# Patient Record
Sex: Female | Born: 1960
Health system: Southern US, Community
[De-identification: ages and names within clinical notes are randomized; demographics above are authoritative.]

## PROBLEM LIST (undated history)

## (undated) DIAGNOSIS — Z86010 Personal history of colonic polyps: Secondary | ICD-10-CM

## (undated) DIAGNOSIS — J309 Allergic rhinitis, unspecified: Secondary | ICD-10-CM

## (undated) DIAGNOSIS — F329 Major depressive disorder, single episode, unspecified: Secondary | ICD-10-CM

## (undated) DIAGNOSIS — E041 Nontoxic single thyroid nodule: Secondary | ICD-10-CM

## (undated) DIAGNOSIS — I8390 Asymptomatic varicose veins of unspecified lower extremity: Secondary | ICD-10-CM

## (undated) DIAGNOSIS — C439 Malignant melanoma of skin, unspecified: Secondary | ICD-10-CM

## (undated) DIAGNOSIS — M19049 Primary osteoarthritis, unspecified hand: Secondary | ICD-10-CM

## (undated) DIAGNOSIS — Z860101 Personal history of adenomatous and serrated colon polyps: Secondary | ICD-10-CM

## (undated) DIAGNOSIS — T7840XA Allergy, unspecified, initial encounter: Secondary | ICD-10-CM

## (undated) DIAGNOSIS — Z8659 Personal history of other mental and behavioral disorders: Secondary | ICD-10-CM

## (undated) HISTORY — DX: Personal history of colonic polyps: Z86.010

## (undated) HISTORY — DX: Asymptomatic varicose veins of unspecified lower extremity: I83.90

## (undated) HISTORY — PX: COLONOSCOPY: SHX174

## (undated) HISTORY — PX: POLYPECTOMY: SHX149

## (undated) HISTORY — DX: Major depressive disorder, single episode, unspecified: F32.9

## (undated) HISTORY — DX: Personal history of other mental and behavioral disorders: Z86.59

## (undated) HISTORY — DX: Nontoxic single thyroid nodule: E04.1

## (undated) HISTORY — PX: COLONOSCOPY W/ POLYPECTOMY: SHX1380

## (undated) HISTORY — PX: ANAL FISSURE REPAIR: SHX2312

## (undated) HISTORY — DX: Primary osteoarthritis, unspecified hand: M19.049

## (undated) HISTORY — DX: Malignant melanoma of skin, unspecified: C43.9

## (undated) HISTORY — DX: Allergic rhinitis, unspecified: J30.9

## (undated) HISTORY — DX: Personal history of adenomatous and serrated colon polyps: Z86.0101

---
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
## 1990-07-10 HISTORY — PX: KNEE ARTHROSCOPY: SUR90

## 1998-07-10 HISTORY — PX: OTHER SURGICAL HISTORY: SHX169

## 1998-12-09 DIAGNOSIS — E041 Nontoxic single thyroid nodule: Secondary | ICD-10-CM

## 1998-12-09 HISTORY — DX: Nontoxic single thyroid nodule: E04.1

## 1998-12-15 ENCOUNTER — Ambulatory Visit (HOSPITAL_COMMUNITY): Admission: RE | Admit: 1998-12-15 | Discharge: 1998-12-15 | Payer: Self-pay | Admitting: Family Medicine

## 1998-12-15 ENCOUNTER — Encounter: Payer: Self-pay | Admitting: Family Medicine

## 1998-12-23 ENCOUNTER — Encounter: Payer: Self-pay | Admitting: Family Medicine

## 1998-12-23 ENCOUNTER — Ambulatory Visit (HOSPITAL_COMMUNITY): Admission: RE | Admit: 1998-12-23 | Discharge: 1998-12-23 | Payer: Self-pay | Admitting: Family Medicine

## 1999-02-18 ENCOUNTER — Ambulatory Visit (HOSPITAL_COMMUNITY): Admission: RE | Admit: 1999-02-18 | Discharge: 1999-02-18 | Payer: Self-pay | Admitting: *Deleted

## 1999-03-10 ENCOUNTER — Ambulatory Visit (HOSPITAL_COMMUNITY): Admission: RE | Admit: 1999-03-10 | Discharge: 1999-03-10 | Payer: Self-pay | Admitting: Obstetrics and Gynecology

## 1999-03-10 ENCOUNTER — Encounter: Payer: Self-pay | Admitting: Obstetrics and Gynecology

## 2000-12-12 ENCOUNTER — Other Ambulatory Visit: Admission: RE | Admit: 2000-12-12 | Discharge: 2000-12-12 | Payer: Self-pay | Admitting: Obstetrics and Gynecology

## 2000-12-14 ENCOUNTER — Encounter (INDEPENDENT_AMBULATORY_CARE_PROVIDER_SITE_OTHER): Payer: Self-pay | Admitting: Specialist

## 2000-12-14 ENCOUNTER — Ambulatory Visit (HOSPITAL_COMMUNITY): Admission: RE | Admit: 2000-12-14 | Discharge: 2000-12-14 | Payer: Self-pay | Admitting: Obstetrics and Gynecology

## 2001-12-23 ENCOUNTER — Inpatient Hospital Stay (HOSPITAL_COMMUNITY): Admission: AD | Admit: 2001-12-23 | Discharge: 2001-12-27 | Payer: Self-pay | Admitting: Obstetrics and Gynecology

## 2002-01-20 ENCOUNTER — Other Ambulatory Visit: Admission: RE | Admit: 2002-01-20 | Discharge: 2002-01-20 | Payer: Self-pay | Admitting: Obstetrics and Gynecology

## 2003-01-27 ENCOUNTER — Other Ambulatory Visit: Admission: RE | Admit: 2003-01-27 | Discharge: 2003-01-27 | Payer: Self-pay | Admitting: Obstetrics and Gynecology

## 2003-08-18 ENCOUNTER — Ambulatory Visit (HOSPITAL_COMMUNITY): Admission: RE | Admit: 2003-08-18 | Discharge: 2003-08-18 | Payer: Self-pay | Admitting: Obstetrics and Gynecology

## 2004-09-09 ENCOUNTER — Ambulatory Visit (HOSPITAL_COMMUNITY): Admission: RE | Admit: 2004-09-09 | Discharge: 2004-09-09 | Payer: Self-pay | Admitting: Obstetrics and Gynecology

## 2005-09-20 ENCOUNTER — Ambulatory Visit (HOSPITAL_COMMUNITY): Admission: RE | Admit: 2005-09-20 | Discharge: 2005-09-20 | Payer: Self-pay | Admitting: Obstetrics and Gynecology

## 2006-08-10 DIAGNOSIS — C439 Malignant melanoma of skin, unspecified: Secondary | ICD-10-CM

## 2006-08-10 HISTORY — DX: Malignant melanoma of skin, unspecified: C43.9

## 2006-09-27 ENCOUNTER — Ambulatory Visit (HOSPITAL_COMMUNITY): Admission: RE | Admit: 2006-09-27 | Discharge: 2006-09-27 | Payer: Self-pay | Admitting: Obstetrics and Gynecology

## 2007-10-02 ENCOUNTER — Ambulatory Visit (HOSPITAL_COMMUNITY): Admission: RE | Admit: 2007-10-02 | Discharge: 2007-10-02 | Payer: Self-pay | Admitting: Obstetrics and Gynecology

## 2008-11-11 ENCOUNTER — Ambulatory Visit (HOSPITAL_COMMUNITY): Admission: RE | Admit: 2008-11-11 | Discharge: 2008-11-11 | Payer: Self-pay | Admitting: Obstetrics and Gynecology

## 2009-11-17 ENCOUNTER — Ambulatory Visit (HOSPITAL_COMMUNITY): Admission: RE | Admit: 2009-11-17 | Discharge: 2009-11-17 | Payer: Self-pay | Admitting: Obstetrics and Gynecology

## 2010-11-25 NOTE — Discharge Summary (Signed)
   NAME:  Tracy Moore, Tracy Moore                       ACCOUNT NO.:  [id number]   MEDICAL RECORD NO.:  [id number]                   PATIENT TYPE:  NP   LOCATION:  9148                                 FACILITY:  [facility]   PHYSICIAN:  Lenoard Aden, M.D.             DATE OF BIRTH:  March 13, 1961   DATE OF ADMISSION:  12/23/2001  DATE OF DISCHARGE:  12/27/2001                                 DISCHARGE SUMMARY   BRIEF HISTORY:  The patient underwent an uncomplicated repeat low-segment  transverse cesarean section on January 22, 2002.   HOSPITAL COURSE:  Postoperative course to include afebrile on postoperative  days #1, 2, 3, and 4. Postpartum hemoglobin 10.4, white blood cell count of  9.4. Tolerated a regular diet while on postoperative day #1.   DISPOSITION:  Discharged to home on day #4 given Tylox #30 for pain and  ibuprofen, prenatal vitamins. Discharge teaching had been done.   FOLLOW UP:  Followup in the office in 4-6 weeks.                                               Lenoard Aden, M.D.    RJT/MEDQ  D:  01/31/2002  T:  02/09/2002  Job:  [id number]

## 2010-11-25 NOTE — Op Note (Signed)
[facility]  Patient:    Tracy Moore, Tracy Moore                    MRN: [id number] Proc. Date: 12/14/00 Adm. Date:  [id number] Attending:  Morene Antu                           Operative Report  PREOPERATIVE DIAGNOSIS:       Missed abortion.  POSTOPERATIVE DIAGNOSIS:      Missed abortion.  PROCEDURE:                    Dilatation and evacuation.  SURGEON:                      Sherry A. Rosalio Macadamia, M.D.  ANESTHESIA:                   MAC.  ESTIMATED BLOOD LOSS:         10 cc.  INDICATIONS:                  This is a 50 year old, G2, P1-0-0-1 woman who was seen for routine prenatal exam. Doppler could not be obtained for fetal heart tones. The patient was 9 to 10 weeks by dates. Ultrasound was performed showing fetal demise approximately 9.1 week size crown-rump length with no fetal heart tones present. Because of this, the patient is brought to the operating room for D&E.  FINDINGS:                     A 9 to 10-week size anteflexed uterus, no adnexal mass.  DESCRIPTION OF PROCEDURE:     The patient was brought into the operating room and given adequate IV sedation. She was placed in a dorsal lithotomy position. Perineum was washed with Betadine. Pelvic examination was performed. Bladder was in-and-out catheterized. The patient was draped in a sterile fashion after surgeons gloves were changed. Speculum was placed within the vagina. The vagina was washed with Betadine. Paracervical block was performed with 1% Nesacaine. Anterior lip of the cervix of the cervix was grasped with a single-tooth tenaculum. The cervix was dilated with the [facility] dilators to a #29. A #9 mm curved curet was introduced into the endometrial cavity. Suction was applied. Placental tissue was removed with polyp forceps and sent for genetic testing. Further suction was continued until no further tissue was present. Very brief sharp curettage was performed with no  tissue present. Suction was repeated with adequate hemostasis and no tissue present. All instruments were removed from the vagina. The patient was taken out of the dorsal lithotomy position. She was awakened. She was moved from the operating table to a stretcher in stable condition. Complications were none. Estimated blood loss was 10 cc. DD:  12/14/00 TD:  12/15/00 Job: [id number]

## 2010-11-25 NOTE — Op Note (Signed)
[facility]  Patient:    Tracy Moore, Tracy Moore Visit Number: [id number] MRN: [id number]          Service Type: OBS Location: [facility] Attending Physician:  Lenoard Aden Dictated by:   Lenoard Aden, M.D. Proc. Date: 12/23/01   CC:         [facility]   Operative Report  PREOPERATIVE DIAGNOSIS:       A 39-week intrauterine pregnancy, two previous cesarean sections with repeat.  POSTOPERATIVE DIAGNOSIS:      A 39-week intrauterine pregnancy, two previous cesarean sections with repeat.  OPERATION:                    Repeat low transverse cesarean section.  SURGEON:                      Lenoard Aden, M.D.  ANESTHESIA:                   Spinal by Gretta Cool., M.D.  ESTIMATED BLOOD LOSS:         1000 cc.  COMPLICATIONS:                None.  DRAINS:                       Foley.  DISPOSITION:                  The patient in recovery in good condition.  FINDINGS:                     Full term living female, occiput anterior position. Placenta, anterior, removed manually intact. Normal tubes, normal ovaries.  BRIEF OPERATIVE NOTE:         After being apprised of the risks of anesthesia, infection, bleeding, intraabdominal organs need for repair, the patient went to the operating room where she was administered a spinal anesthetic without complications and prepped and draped in the usual sterile fashion. A Foley catheter was placed. After achieving adequate anesthesia, dilute Marcaine solution was placed in the area of the old Pfannenstiel skin incision. An incision was made with a scalpel, carried down to the fascia, which was nicked in the midline and open transversely using Mayo scissors. The rectus muscles dissected sharply in the midline. The peritoneum entered sharply and  a bladder blade placed. Visceral peritoneum scored in a smile-like fashion, the uterus scored in a smile-like fashion and was cleared of fluid.  Atraumatic delivery of a full-term living female, handed to the pediatricians in attendance. Apgars 8 and 9. The placenta delivered manually intact, three-vessel cord noted. Uterus was exteriorized. Normal tubes and ovaries noted. The uterus curetted using a dry lap pack and closed using 0 Monocryl in continuous running fashion. A second imbricating layer placed. Normal tubes and ovaries reinspected.  Bladder flap reinspected. Additional suture placed at the left lateral margin of the incision for hemostasis which is achieved without difficulty with one interrupted suture. After irrigation was accomplished, the fascia closed using a 0 Vicryl in continuous running fashion. The skin closed using staples. The patient tolerated the procedure well and was transferred to recovery in good condition. Dictated by:   Lenoard Aden, M.D. Attending Physician:  Lenoard Aden DD:  12/23/01 TD:  12/25/01 Job: [id number]

## 2010-11-25 NOTE — H&P (Signed)
[facility]  Patient:    Tracy Moore, Tracy Moore Visit Number: [id number] MRN: [id number]          Service Type: OBS Location: [facility] Attending Physician:  Lenoard Aden Dictated by:   Lenoard Aden, M.D. Adm. Date:  12/23/01                           History and Physical  CHIEF COMPLAINT:              Elective repeat cesarean section.  HISTORY OF PRESENT ILLNESS:   The patient is a 50 year old white female, G3, P1, EDD of December 30, 2001, at 39 weeks, who presents for elective repeat C-section.  ALLERGIES:                    No known drug allergies.  MEDICATIONS;                  Prenatal vitamins.  PAST OBSTETRIC HISTORY:       1. History of primary C-section in 1997, 10                                  pound female.                               2. Spontaneous loss and fix of tube.  FAMILY HISTORY:               Hypertension, diabetes.  Otherwise noncontributory.  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  REVIEW OF SYSTEMS:            Otherwise negative.  PRENATAL LABORATORY DATA:     Blood type 0 negative, Rh antibody negative, Rubella immune, hepatitis B surface antigen negative, HIV nonreactive.  GC and Chlamydia negative.  PHYSICAL EXAMINATION:  GENERAL:                      Well-developed, well-nourished white female in no apparent distress.  HEENT:                        Normal.  LUNGS:                        Clear.  HEART:                        Regular rate and rhythm.  ABDOMEN:                      Soft, gravid, nontender.  Estimated fetal weight of 8,5 pounds.  PELVIC:                       Cervix 2 cm, thick, vertex, and out of the pelvis.  EXTREMITIES:                  No cords.  NEUROLOGIC:                   Nonfocal.  IMPRESSION:                   A 39 week intrauterine pregnancy  for repeat cesarean section.  PLAN:                         Proceed with elective  repeat cesarean section. Risks of anesthesia, infection, bleeding, injury to abdominal organs with need to repair are discussed.  The patient acknowledges and desires to proceed. Dictated by:   Lenoard Aden, M.D. Attending Physician:  Lenoard Aden DD:  12/23/01 TD:  12/23/01 Job: [id number]

## 2010-12-01 ENCOUNTER — Other Ambulatory Visit (HOSPITAL_COMMUNITY): Payer: Self-pay | Admitting: Obstetrics and Gynecology

## 2010-12-01 DIAGNOSIS — Z1231 Encounter for screening mammogram for malignant neoplasm of breast: Secondary | ICD-10-CM

## 2010-12-19 ENCOUNTER — Ambulatory Visit (HOSPITAL_COMMUNITY)
Admission: RE | Admit: 2010-12-19 | Discharge: 2010-12-19 | Disposition: A | Discharge status: Home / Self Care | Payer: BC Managed Care – PPO | Source: Ambulatory Visit | Attending: Obstetrics and Gynecology | Admitting: Obstetrics and Gynecology

## 2010-12-19 DIAGNOSIS — Z1231 Encounter for screening mammogram for malignant neoplasm of breast: Secondary | ICD-10-CM | POA: Insufficient documentation

## 2011-12-08 ENCOUNTER — Other Ambulatory Visit (HOSPITAL_COMMUNITY): Payer: Self-pay | Admitting: Obstetrics and Gynecology

## 2011-12-08 DIAGNOSIS — Z1231 Encounter for screening mammogram for malignant neoplasm of breast: Secondary | ICD-10-CM

## 2012-01-10 ENCOUNTER — Ambulatory Visit (HOSPITAL_COMMUNITY): Payer: BC Managed Care – PPO

## 2012-01-10 ENCOUNTER — Ambulatory Visit (HOSPITAL_COMMUNITY)
Admission: RE | Admit: 2012-01-10 | Discharge: 2012-01-10 | Disposition: A | Discharge status: Home / Self Care | Payer: PRIVATE HEALTH INSURANCE | Source: Ambulatory Visit | Attending: Obstetrics and Gynecology | Admitting: Obstetrics and Gynecology

## 2012-01-10 DIAGNOSIS — Z1231 Encounter for screening mammogram for malignant neoplasm of breast: Secondary | ICD-10-CM | POA: Insufficient documentation

## 2013-01-28 ENCOUNTER — Other Ambulatory Visit (HOSPITAL_COMMUNITY): Payer: Self-pay | Admitting: Obstetrics and Gynecology

## 2013-01-28 DIAGNOSIS — Z1231 Encounter for screening mammogram for malignant neoplasm of breast: Secondary | ICD-10-CM

## 2013-02-12 ENCOUNTER — Ambulatory Visit (HOSPITAL_COMMUNITY)
Admission: RE | Admit: 2013-02-12 | Discharge: 2013-02-12 | Disposition: A | Discharge status: Home / Self Care | Payer: PRIVATE HEALTH INSURANCE | Source: Ambulatory Visit | Attending: Obstetrics and Gynecology | Admitting: Obstetrics and Gynecology

## 2013-02-12 DIAGNOSIS — Z1231 Encounter for screening mammogram for malignant neoplasm of breast: Secondary | ICD-10-CM | POA: Insufficient documentation

## 2013-08-27 ENCOUNTER — Ambulatory Visit: Payer: PRIVATE HEALTH INSURANCE | Admitting: Family Medicine

## 2013-09-02 ENCOUNTER — Ambulatory Visit: Payer: PRIVATE HEALTH INSURANCE | Admitting: Family Medicine

## 2013-09-08 ENCOUNTER — Encounter: Payer: Self-pay | Admitting: Family Medicine

## 2013-09-08 ENCOUNTER — Ambulatory Visit (INDEPENDENT_AMBULATORY_CARE_PROVIDER_SITE_OTHER): Payer: PRIVATE HEALTH INSURANCE | Admitting: Family Medicine

## 2013-09-08 VITALS — BP 103/70 | HR 62 | Temp 98.2°F | Resp 16 | Ht 66.5 in | Wt 164.0 lb

## 2013-09-08 DIAGNOSIS — Z7689 Persons encountering health services in other specified circumstances: Secondary | ICD-10-CM

## 2013-09-08 DIAGNOSIS — E041 Nontoxic single thyroid nodule: Secondary | ICD-10-CM | POA: Diagnosis not present

## 2013-09-08 DIAGNOSIS — Z7189 Other specified counseling: Secondary | ICD-10-CM

## 2013-09-08 NOTE — Progress Notes (Signed)
Office Note 09/08/2013  CC:  Chief Complaint  Patient presents with  . Establish Care    HPI:  Tracy Moore is a 54 y.o. White female who is here to establish care. Patient's most recent primary MD: Dr. Harriet Butte at [facility] due to insurance. Old records were not reviewed prior to or during today's visit.  No acute complaints to discuss today.  Past Medical History  Diagnosis Date  . Melanoma 2012    Righ thigh (Dr. Nevada Crane)  . Depression   . Thyroid nodule     Dr. Dwyane Dee following q1-2 yrs.  Pt has been euthyroid (was on synthroid at one point in time but this was d/c'd per pt)  . Hay fever     spring/fall    Past Surgical History  Procedure Laterality Date  . Cesarean section  1997 and 2003  . Knee arthroscopy Left 1992    Family History  Problem Relation Age of Onset  . Arthritis Mother   . Hypertension Father   . Arthritis Father   . Heart disease Father   . Autism Son   . Dementia Maternal Grandmother   . Cancer Maternal Grandfather     prostate  . Cancer Paternal Grandfather     colon cancer    History   Social History  . Marital Status: Married    Spouse Name: N/A    Number of Children: N/A  . Years of Education: N/A   Occupational History  . Not on file.   Social History Main Topics  . Smoking status: Never Smoker   . Smokeless tobacco: Never Used  . Alcohol Use: Yes     Comment: socially  . Drug Use: No  . Sexual Activity: Not on file   Other Topics Concern  . Not on file   Social History Narrative   Married, daughter 65 y/o , son 22 y/o (has autism).   Lives in [place].   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  No drugs.    Outpatient Encounter Prescriptions as of 09/08/2013  Medication Sig  . Docusate Sodium 100 MG/5ML ENEM Take 100 mg by mouth as needed.  Marland Kitchen levonorgestrel (MIRENA) 20 MCG/24HR IUD 1 each by Intrauterine route once.    No Known Allergies  ROS Review of Systems   Constitutional: Negative for fever and fatigue.  HENT: Negative for congestion and sore throat.   Eyes: Negative for visual disturbance.  Respiratory: Negative for cough.   Cardiovascular: Negative for chest pain.  Gastrointestinal: Negative for nausea and abdominal pain.  Genitourinary: Negative for dysuria.  Musculoskeletal: Positive for neck pain (seeing a chiropracter recently). Negative for back pain and joint swelling.  Skin: Negative for rash.  Neurological: Negative for weakness and headaches.  Hematological: Negative for adenopathy.    PE; Blood pressure 103/70, pulse 62, temperature 98.2 F (36.8 C), temperature source Temporal, resp. rate 16, height 5' 6.5" (1.689 m), weight 164 lb (74.39 kg), SpO2 98.00%. Gen: Alert, well appearing.  Patient is oriented to person, place, time, and situation. ZDG:LOVF: no injection, icteris, swelling, or exudate.  EOMI, PERRLA. Mouth: lips without lesion/swelling.  Oral mucosa pink and moist. Oropharynx without erythema, exudate, or swelling.  CV: RRR, no m/r/g.   LUNGS: CTA bilat, nonlabored resps, good aeration in all lung fields. EXT: no clubbing, cyanosis, or edema.   Pertinent labs:  None today  ASSESSMENT AND PLAN:   Thyroid nodule Per pt has been determined to  be benign and stable on f/u with Dr. Dwyane Dee. Obtain records. Has been euthyroid.  Encounter to establish care with new doctor Discussed med,surg,social, famx hx. Plan for CPE in the next few months, refer for colon cancer screening at that time.   An After Visit Summary was printed and given to the patient.  Return for schedule CPE (fasting) in late spring/early summer.

## 2013-09-08 NOTE — Assessment & Plan Note (Signed)
Discussed med,surg,social, famx hx. Plan for CPE in the next few months, refer for colon cancer screening at that time.

## 2013-09-08 NOTE — Assessment & Plan Note (Signed)
Per pt has been determined to be benign and stable on f/u with Dr. Dwyane Dee. Obtain records. Has been euthyroid.

## 2013-09-08 NOTE — Progress Notes (Signed)
Pre visit review using our clinic review tool, if applicable. No additional management support is needed unless otherwise documented below in the visit note. 

## 2013-09-29 ENCOUNTER — Encounter: Payer: Self-pay | Admitting: Family Medicine

## 2013-10-06 ENCOUNTER — Ambulatory Visit (INDEPENDENT_AMBULATORY_CARE_PROVIDER_SITE_OTHER): Payer: PRIVATE HEALTH INSURANCE | Admitting: Endocrinology

## 2013-10-06 ENCOUNTER — Encounter: Payer: Self-pay | Admitting: Endocrinology

## 2013-10-06 VITALS — BP 110/74 | HR 73 | Temp 97.8°F | Resp 16 | Ht 66.5 in | Wt 164.0 lb

## 2013-10-06 DIAGNOSIS — E041 Nontoxic single thyroid nodule: Secondary | ICD-10-CM

## 2013-10-06 DIAGNOSIS — R635 Abnormal weight gain: Secondary | ICD-10-CM

## 2013-10-06 LAB — T4, FREE: Free T4: 0.77 ng/dL (ref 0.60–1.60)

## 2013-10-06 LAB — TSH: TSH: 0.76 u[IU]/mL (ref 0.35–5.50)

## 2013-10-06 NOTE — Progress Notes (Signed)
Patient ID: Tracy Moore, female   DOB: 1961-01-25, 53 y.o.   MRN: [id number]    Reason for Appointment: Followup of thyroid nodule     History of Present Illness:   The patient's thyroid nodule was first discovered in the year 2000 incidentally by her gynecologist Initially the nodule was 14 mm in size. Biopsy was done soon after diagnosis and showed mostly colloid with a few benign follicular cells Clinically the nodule is about 2 cm in 2003 and has been about the same size since then. She was also tried on thyroid suppression for sometime but this caused relatively low TSH Also previously had negative thyroid antibodies  She has had no difficulty with swallowing  Does not feel like she has any choking sensation in her neck or pressure in any position or when lying down.  No results found for this basename: TSH   No results found for any previous visit.     Medication List       This list is accurate as of: 10/06/13  9:17 AM.  Always use your most recent med list.               Docusate Sodium 100 MG/5ML Enem  Take 100 mg by mouth as needed.     levonorgestrel 20 MCG/24HR IUD  Commonly known as:  MIRENA  1 each by Intrauterine route once.     Resveratrol 250 MG Caps  Take by mouth.     St Johns Wort 1000 MG Caps  Take by mouth.        Allergies: No Known Allergies  Past Medical History  Diagnosis Date  . Melanoma 08/2006    Righ thigh (Dr. Nevada Crane)  . Depression   . Thyroid nodule 12/1998    Bx benign.Dr. Dwyane Dee following q1-2 yrs.  Pt has been euthyroid (was on synthroid at one point in time but this was d/c'd per pt)  . Hay fever     spring/fall    Past Surgical History  Procedure Laterality Date  . Cesarean section  1997 and 2003  . Knee arthroscopy Left 1992  . Thyroid nodule biopsy  2000    Benign    Family History  Problem Relation Age of Onset  . Arthritis Mother   . Hypertension Father   . Arthritis Father   . Heart disease Father   .  Autism Son   . Dementia Maternal Grandmother   . Cancer Maternal Grandfather     prostate  . Cancer Paternal Grandfather     colon cancer    Social History:  reports that she has never smoked. She has never used smokeless tobacco. She reports that she drinks alcohol. She reports that she does not use illicit drugs.   Review of Systems:  She is concerned about her tendency to weight gain. She has gained about 12 pounds Since 2009 She tries to exercise regularly with walking 2-6 miles per week Did not complain of any unusual fatigue. Is not interested in a weight loss medication  There is no history of high blood pressure.             No  history of Diabetes.      She is on [employer] for  "mood stabilization"   Not clear if she is in menopause since she is has an IUD with progesterone. Has seen gynecologist recently          Examination:   BP 110/74  Pulse  73  Temp(Src) 97.8 F (36.6 C)  Resp 16  Ht 5' 6.5" (1.689 m)  Wt 164 lb (74.39 kg)  BMI 26.08 kg/m2  SpO2 96%   General Appearance:  well-looking, no puffiness of the face or eyes         Eyes:  normal externally        Neck: The thyroid shows a left-sided 2 cm firm nodule, smooth. Right side is not palpable. No lymphadenopathy in the neck REFLEXES: at biceps are normal.  Skin: No dryness, no swelling of hands        Assessment/Plan:  Long-standing solitary left-sided nodule, appears unchanged in size since 2009 This had been benign on biopsy in the year 2000   Reassured her that the thyroid nodule is stable and does not need any further evaluation Will follow her every 2 years  Discussed general principles of weight management with diet and exercise Will check thyroid functions also today   [facility] 10/06/2013

## 2013-10-07 ENCOUNTER — Encounter: Payer: Self-pay | Admitting: Family Medicine

## 2013-10-07 NOTE — Progress Notes (Signed)
Quick Note:  Please let patient know that the lab result is normal and no further action needed ______ 

## 2014-02-25 ENCOUNTER — Other Ambulatory Visit (HOSPITAL_COMMUNITY): Payer: Self-pay | Admitting: Obstetrics and Gynecology

## 2014-02-25 DIAGNOSIS — Z1231 Encounter for screening mammogram for malignant neoplasm of breast: Secondary | ICD-10-CM

## 2014-03-04 ENCOUNTER — Ambulatory Visit (INDEPENDENT_AMBULATORY_CARE_PROVIDER_SITE_OTHER): Payer: PRIVATE HEALTH INSURANCE | Admitting: Family Medicine

## 2014-03-04 ENCOUNTER — Encounter: Payer: Self-pay | Admitting: Family Medicine

## 2014-03-04 VITALS — BP 117/77 | HR 65 | Temp 98.0°F | Resp 16 | Ht 66.5 in | Wt 165.0 lb

## 2014-03-04 DIAGNOSIS — B80 Enterobiasis: Secondary | ICD-10-CM | POA: Insufficient documentation

## 2014-03-04 DIAGNOSIS — Z1211 Encounter for screening for malignant neoplasm of colon: Secondary | ICD-10-CM

## 2014-03-04 DIAGNOSIS — Z0000 Encounter for general adult medical examination without abnormal findings: Secondary | ICD-10-CM

## 2014-03-04 LAB — COMPREHENSIVE METABOLIC PANEL
ALBUMIN: 3.9 g/dL (ref 3.5–5.2)
ALK PHOS: 66 U/L (ref 39–117)
ALT: 28 U/L (ref 0–35)
AST: 21 U/L (ref 0–37)
BILIRUBIN TOTAL: 0.7 mg/dL (ref 0.2–1.2)
BUN: 13 mg/dL (ref 6–23)
CALCIUM: 9 mg/dL (ref 8.4–10.5)
CO2: 28 mEq/L (ref 19–32)
CREATININE: 0.8 mg/dL (ref 0.4–1.2)
Chloride: 104 mEq/L (ref 96–112)
GFR: 85.12 mL/min (ref >60.00)
Glucose, Bld: 79 mg/dL (ref 70–99)
POTASSIUM: 4.3 meq/L (ref 3.5–5.1)
Sodium: 138 mEq/L (ref 135–145)
TOTAL PROTEIN: 7.1 g/dL (ref 6.0–8.3)

## 2014-03-04 LAB — CBC WITH DIFFERENTIAL/PLATELET
BASOS ABS: 0.1 10*3/uL (ref 0.0–0.1)
BASOS PCT: 1.1 % (ref 0.0–3.0)
Eosinophils Absolute: 0.1 10*3/uL (ref 0.0–0.7)
Eosinophils Relative: 1.8 % (ref 0.0–5.0)
HCT: 38.6 % (ref 36.0–46.0)
HEMOGLOBIN: 12.8 g/dL (ref 12.0–15.0)
LYMPHS PCT: 34.7 % (ref 12.0–46.0)
Lymphs Abs: 1.7 10*3/uL (ref 0.7–4.0)
MCHC: 33.3 g/dL (ref 30.0–36.0)
MCV: 92.2 fl (ref 78.0–100.0)
MONOS PCT: 7 % (ref 3.0–12.0)
Monocytes Absolute: 0.3 10*3/uL (ref 0.1–1.0)
NEUTROS PCT: 55.4 % (ref 43.0–77.0)
Neutro Abs: 2.8 10*3/uL (ref 1.4–7.7)
PLATELETS: 309 10*3/uL (ref 150.0–400.0)
RBC: 4.18 Mil/uL (ref 3.87–5.11)
RDW: 14.2 % (ref 11.5–15.5)
WBC: 5 10*3/uL (ref 4.0–10.5)

## 2014-03-04 LAB — LIPID PANEL
CHOL/HDL RATIO: 5
Cholesterol: 227 mg/dL — ABNORMAL HIGH (ref 0–200)
HDL: 48 mg/dL (ref >39.00)
LDL Cholesterol: 150 mg/dL — ABNORMAL HIGH (ref 0–99)
NONHDL: 179
Triglycerides: 143 mg/dL (ref 0.0–149.0)
VLDL: 28.6 mg/dL (ref 0.0–40.0)

## 2014-03-04 MED ORDER — ALBENDAZOLE 200 MG PO TABS: ORAL_TABLET | ORAL | Status: DC

## 2014-03-04 NOTE — Assessment & Plan Note (Signed)
Reviewed age and gender appropriate health maintenance issues (prudent diet, regular exercise, health risks of tobacco and excessive alcohol, use of seatbelts, fire alarms in home, use of sunscreen).  Also reviewed age and gender appropriate health screening as well as vaccine recommendations. Discussed OTC calcium supplement 1200-1500 mg qd to go along with the vit D in her MVI for osteoporosis prevention. She'll get flu vaccine through her employer [facility] health) in the next 1-2 months. She will call back with preference of GI MD to refer to for her initial colon cancer screening.

## 2014-03-04 NOTE — Assessment & Plan Note (Signed)
Abendazole 400mg  x 1 dose, then repeat in 2 wks.

## 2014-03-04 NOTE — Progress Notes (Signed)
Office Note 03/04/2014  CC:  Chief Complaint  Patient presents with  . Annual Exam    fasting   HPI:  Tracy Moore is a 53 y.o. White female who is here for fasting CPE. Gets GYN care with GYN MD--all UTD--gets mammogram next month. She sees a dermatologist for routine f/u for hx of melanoma (Dr. Nevada Crane). Thyroid nodule f/u by Dr. Dwyane Dee was done 09/2013, no imaging needed.  Her thyroid labs were normal and she was told to f/u with him in 2 yrs.  She describes having pinworms this summer, took OTC "pin-X" and says it went away x 2 wks, then returned 2 wks later--sees little tiny squiggly worms in stool.  Has a little bit of anal itching.   Past Medical History  Diagnosis Date  . Melanoma 08/2006    Righ thigh (Dr. Nevada Crane)  . Depression   . Thyroid nodule 12/1998    Bx benign.Dr. Dwyane Dee following q1-2 yrs.  Pt has been euthyroid (was on synthroid at one point in time but this was d/c'd per pt)  . Hay fever     spring/fall  . Varicose veins     Past Surgical History  Procedure Laterality Date  . Cesarean section  1997 and 2003  . Knee arthroscopy Left 1992  . Thyroid nodule biopsy  2000    Benign    Family History  Problem Relation Age of Onset  . Arthritis Mother   . Hypertension Father   . Arthritis Father   . Heart disease Father   . Thyroid disease Father   . Autism Son   . Dementia Maternal Grandmother   . Cancer Maternal Grandfather     prostate  . Cancer Paternal Grandfather     pancreatic cancer    History   Social History  . Marital Status: Married    Spouse Name: N/A    Number of Children: N/A  . Years of Education: N/A   Occupational History  . Not on file.   Social History Main Topics  . Smoking status: Never Smoker   . Smokeless tobacco: Never Used  . Alcohol Use: Yes     Comment: socially  . Drug Use: No  . Sexual Activity: Not on file   Other Topics Concern  . Not on file   Social History Narrative   Married, daughter 4 y/o ,  son 32 y/o (has autism).   Lives in [place].   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  No drugs.    Outpatient Prescriptions Prior to Visit  Medication Sig Dispense Refill  . levonorgestrel (MIRENA) 20 MCG/24HR IUD 1 each by Intrauterine route once.      Mariane Baumgarten Sodium 100 MG/5ML ENEM Take 100 mg by mouth as needed.      Marland Kitchen Resveratrol 250 MG CAPS Take by mouth.      Francella Solian Johns Wort 1000 MG CAPS Take by mouth.       No facility-administered medications prior to visit.    No Known Allergies  ROS Review of Systems  Constitutional: Negative for fever, chills, appetite change and fatigue.  HENT: Negative for congestion, dental problem, ear pain and sore throat.   Eyes: Negative for discharge, redness and visual disturbance.  Respiratory: Negative for cough, chest tightness, shortness of breath and wheezing.   Cardiovascular: Negative for chest pain, palpitations and leg swelling.  Gastrointestinal: Negative for nausea, vomiting, abdominal pain, diarrhea and blood in  stool.  Genitourinary: Negative for dysuria, urgency, frequency, hematuria, flank pain and difficulty urinating.  Musculoskeletal: Negative for arthralgias, back pain, joint swelling, myalgias and neck stiffness.  Skin: Negative for pallor and rash.  Neurological: Negative for dizziness, speech difficulty, weakness and headaches.  Hematological: Negative for adenopathy. Does not bruise/bleed easily.  Psychiatric/Behavioral: Negative for confusion and sleep disturbance. The patient is not nervous/anxious.     PE; Blood pressure 117/77, pulse 65, temperature 98 F (36.7 C), temperature source Temporal, resp. rate 16, height 5' 6.5" (1.689 m), weight 165 lb (74.844 kg), SpO2 98.00%. Gen: Alert, well appearing.  Patient is oriented to person, place, time, and situation. AFFECT: pleasant, lucid thought and speech. ENT: Ears: EACs clear, normal epithelium.  TMs with good light reflex and  landmarks bilaterally.  Eyes: no injection, icteris, swelling, or exudate.  EOMI, PERRLA. Nose: no drainage or turbinate edema/swelling.  No injection or focal lesion.  Mouth: lips without lesion/swelling.  Oral mucosa pink and moist.  Dentition intact and without obvious caries or gingival swelling.  Oropharynx without erythema, exudate, or swelling.  Neck: supple/nontender.  No LAD.  She has a rubbery, moveable, approx 2 cm thyroid nodule.   CV: RRR, no m/r/g.   LUNGS: CTA bilat, nonlabored resps, good aeration in all lung fields. ABD: soft, NT, ND, BS normal.  No hepatospenomegaly or mass.  No bruits. EXT: no clubbing, cyanosis, or edema.  Musculoskeletal: no joint swelling, erythema, warmth, or tenderness.  ROM of all joints intact. Skin - no sores or suspicious lesions or rashes or color changes   Pertinent labs:  Lab Results  Component Value Date   TSH 0.76 10/06/2013  Free T4 normal 10/06/13  ASSESSMENT AND PLAN:   Health maintenance examination Reviewed age and gender appropriate health maintenance issues (prudent diet, regular exercise, health risks of tobacco and excessive alcohol, use of seatbelts, fire alarms in home, use of sunscreen).  Also reviewed age and gender appropriate health screening as well as vaccine recommendations. Discussed OTC calcium supplement 1200-1500 mg qd to go along with the vit D in her MVI for osteoporosis prevention. She'll get flu vaccine through her employer [facility] health) in the next 1-2 months. She will call back with preference of GI MD to refer to for her initial colon cancer screening.  Pinworms Abendazole 400mg  x 1 dose, then repeat in 2 wks.   An After Visit Summary was printed and given to the patient.  FOLLOW UP:  Return in about 1 year (around 03/05/2015) for annual CPE (fasting).

## 2014-03-04 NOTE — Patient Instructions (Signed)
Buy OTC calcium supplement: take 1200-1500 mg a day in divided dose.

## 2014-03-18 ENCOUNTER — Ambulatory Visit (HOSPITAL_COMMUNITY)
Admission: RE | Admit: 2014-03-18 | Discharge: 2014-03-18 | Disposition: A | Discharge status: Home / Self Care | Payer: PRIVATE HEALTH INSURANCE | Source: Ambulatory Visit | Attending: Obstetrics and Gynecology | Admitting: Obstetrics and Gynecology

## 2014-03-18 DIAGNOSIS — Z1231 Encounter for screening mammogram for malignant neoplasm of breast: Secondary | ICD-10-CM | POA: Diagnosis not present

## 2014-05-18 ENCOUNTER — Telehealth: Payer: Self-pay | Admitting: Family Medicine

## 2014-05-18 DIAGNOSIS — Z1211 Encounter for screening for malignant neoplasm of colon: Secondary | ICD-10-CM

## 2014-05-18 NOTE — Telephone Encounter (Signed)
Patient requesting referral for screening colonoscopy with Dr. Hilarie Fredrickson at [place]. Patient has never had a colonoscopy.

## 2014-05-18 NOTE — Telephone Encounter (Signed)
Please advise 

## 2014-05-19 ENCOUNTER — Encounter: Payer: Self-pay | Admitting: Internal Medicine

## 2014-05-19 NOTE — Telephone Encounter (Signed)
OK.  GI order to Dr. Hilarie Fredrickson entered per pt's request.

## 2014-07-16 ENCOUNTER — Ambulatory Visit (AMBULATORY_SURGERY_CENTER): Payer: Self-pay | Admitting: *Deleted

## 2014-07-16 VITALS — Ht 66.5 in | Wt 167.0 lb

## 2014-07-16 DIAGNOSIS — Z1211 Encounter for screening for malignant neoplasm of colon: Secondary | ICD-10-CM

## 2014-07-16 MED ORDER — MOVIPREP 100 G PO SOLR: 1.0000 | Freq: Once | ORAL | Status: DC

## 2014-07-16 NOTE — Progress Notes (Signed)
No egg or soy allergy. No anesthesia problems.  No diet meds,   No home O2.

## 2014-07-24 NOTE — Addendum Note (Signed)
Addended by: Steva Ready on: 07/24/2014 07:11 AM   Modules accepted: Level of Service

## 2014-07-30 ENCOUNTER — Encounter: Payer: PRIVATE HEALTH INSURANCE | Admitting: Internal Medicine

## 2014-08-07 ENCOUNTER — Encounter: Payer: Self-pay | Admitting: Internal Medicine

## 2014-08-07 ENCOUNTER — Ambulatory Visit (AMBULATORY_SURGERY_CENTER): Payer: PRIVATE HEALTH INSURANCE | Admitting: Internal Medicine

## 2014-08-07 VITALS — BP 135/78 | HR 64 | Temp 98.7°F | Resp 33 | Ht 66.0 in | Wt 167.0 lb

## 2014-08-07 DIAGNOSIS — Z1211 Encounter for screening for malignant neoplasm of colon: Secondary | ICD-10-CM

## 2014-08-07 DIAGNOSIS — D122 Benign neoplasm of ascending colon: Secondary | ICD-10-CM

## 2014-08-07 HISTORY — PX: COLONOSCOPY W/ POLYPECTOMY: SHX1380

## 2014-08-07 MED ORDER — SODIUM CHLORIDE 0.9 % IV SOLN: 500.0000 mL | INTRAVENOUS | Status: DC

## 2014-08-07 NOTE — Op Note (Signed)
[facility]  [address] [place], [address]   COLONOSCOPY PROCEDURE REPORT  PATIENT: Tracy Moore, Tracy Moore  MR#: [id number] BIRTHDATE: [birth year] , 53  yrs. old GENDER: female ENDOSCOPIST: Jerene Bears, MD REFERRED EY:[id number] Anitra Lauth, M.D. PROCEDURE DATE:  08/07/2014 PROCEDURE:   Colonoscopy with snare polypectomy First Screening Colonoscopy - Avg.  risk and is 50 yrs.  old or older Yes.  Prior Negative Screening - Now for repeat screening. N/A  History of Adenoma - Now for follow-up colonoscopy & has been > or = to 3 yrs.  N/A  Polyps Removed Today? Yes. ASA CLASS:   Class II INDICATIONS:average risk for colon cancer and first colonoscopy. MEDICATIONS: Monitored anesthesia care and Propofol 300 mg IV  DESCRIPTION OF PROCEDURE:   After the risks benefits and alternatives of the procedure were thoroughly explained, informed consent was obtained.  The digital rectal exam revealed no rectal mass.   The LB [id number]  endoscope was introduced through the anus and advanced to the terminal ileum which was intubated for a short distance. No adverse events experienced.   The quality of the prep was good, using MoviPrep  The instrument was then slowly withdrawn as the colon was fully examined.   COLON FINDINGS: The examined terminal ileum appeared to be normal. A sessile polyp measuring 5 mm in size was found in the ascending colon.  A polypectomy was performed with a cold snare.  The resection was complete, the polyp tissue was completely retrieved and sent to histology.   The examination was otherwise normal. Retroflexed views revealed internal hemorrhoids. The time to cecum=2 minutes 27 seconds.  Withdrawal time=11 minutes 29 seconds. The scope was withdrawn and the procedure completed. COMPLICATIONS: There were no immediate complications.  ENDOSCOPIC IMPRESSION: 1.   The examined terminal ileum appeared to be normal 2.   Sessile polyp was found in the  ascending colon; polypectomy was performed with a cold snare 3.   The examination was otherwise normal  RECOMMENDATIONS: 1.  Await pathology results 2.  If the polyp removed today is proven to be an adenomatous (pre-cancerous) polyp, you will need a repeat colonoscopy in 5 years.  Otherwise you should continue to follow colorectal cancer screening guidelines for "routine risk" patients with colonoscopy in 10 years.  You will receive a letter within 1-2 weeks with the results of your biopsy as well as final recommendations.  Please call my office if you have not received a letter after 3 weeks.  eSigned:  Jerene Bears, MD 08/07/2014 10:29 AM   cc: Ricardo Jericho, MD and The Patient

## 2014-08-07 NOTE — Progress Notes (Signed)
Called to room to assist during endoscopic procedure.  Patient ID and intended procedure confirmed with present staff. Received instructions for my participation in the procedure from the performing physician.  

## 2014-08-07 NOTE — Patient Instructions (Signed)
YOU HAD AN ENDOSCOPIC PROCEDURE TODAY AT THE [facility]: Refer to the procedure report that was given to you for any specific questions about what was found during the examination.  If the procedure report does not answer your questions, please call your gastroenterologist to clarify.  If you requested that your care partner not be given the details of your procedure findings, then the procedure report has been included in a sealed envelope for you to review at your convenience later.  YOU SHOULD EXPECT: Some feelings of bloating in the abdomen. Passage of more gas than usual.  Walking can help get rid of the air that was put into your GI tract during the procedure and reduce the bloating. If you had a lower endoscopy (such as a colonoscopy or flexible sigmoidoscopy) you may notice spotting of blood in your stool or on the toilet paper. If you underwent a bowel prep for your procedure, then you may not have a normal bowel movement for a few days.  DIET: Your first meal following the procedure should be a light meal and then it is ok to progress to your normal diet.  A half-sandwich or bowl of soup is an example of a good first meal.  Heavy or fried foods are harder to digest and may make you feel nauseous or bloated.  Likewise meals heavy in dairy and vegetables can cause extra gas to form and this can also increase the bloating.  Drink plenty of fluids but you should avoid alcoholic beverages for 24 hours.  ACTIVITY: Your care partner should take you home directly after the procedure.  You should plan to take it easy, moving slowly for the rest of the day.  You can resume normal activity the day after the procedure however you should NOT DRIVE or use heavy machinery for 24 hours (because of the sedation medicines used during the test).    SYMPTOMS TO REPORT IMMEDIATELY: A gastroenterologist can be reached at any hour.  During normal business hours, 8:30 AM to 5:00 PM Monday through Friday,  call [phone].  After hours and on weekends, please call the GI answering service at [phone] who will take a message and have the physician on call contact you.   Following lower endoscopy (colonoscopy or flexible sigmoidoscopy):  Excessive amounts of blood in the stool  Significant tenderness or worsening of abdominal pains  Swelling of the abdomen that is new, acute  Fever of 100F or higher  FOLLOW UP: If any biopsies were taken you will be contacted by phone or by letter within the next 1-3 weeks.  Call your gastroenterologist if you have not heard about the biopsies in 3 weeks.  Our staff will call the home number listed on your records the next business day following your procedure to check on you and address any questions or concerns that you may have at that time regarding the information given to you following your procedure. This is a courtesy call and so if there is no answer at the home number and we have not heard from you through the emergency physician on call, we will assume that you have returned to your regular daily activities without incident.  SIGNATURES/CONFIDENTIALITY: You and/or your care partner have signed paperwork which will be entered into your electronic medical record.  These signatures attest to the fact that that the information above on your After Visit Summary has been reviewed and is understood.  Full responsibility of the confidentiality of this   discharge information lies with you and/or your care-partner.  Next colonoscopy determined by pathology results. Discharge instructions provided to patient and/or care partner. Polyp handout provided.

## 2014-08-07 NOTE — Progress Notes (Signed)
A/ox3, pleased with MAC, report to RN 

## 2014-08-10 ENCOUNTER — Telehealth: Payer: Self-pay

## 2014-08-10 NOTE — Telephone Encounter (Signed)
Reached recording that patient is not taking calls at this time.

## 2014-08-11 ENCOUNTER — Encounter: Payer: Self-pay | Admitting: Internal Medicine

## 2014-10-20 LAB — HM PAP SMEAR: HM Pap smear: NORMAL

## 2015-02-22 ENCOUNTER — Other Ambulatory Visit (HOSPITAL_COMMUNITY): Payer: Self-pay | Admitting: Obstetrics and Gynecology

## 2015-02-22 DIAGNOSIS — Z1231 Encounter for screening mammogram for malignant neoplasm of breast: Secondary | ICD-10-CM

## 2015-03-23 ENCOUNTER — Ambulatory Visit (HOSPITAL_COMMUNITY)
Admission: RE | Admit: 2015-03-23 | Discharge: 2015-03-23 | Disposition: A | Discharge status: Home / Self Care | Payer: BLUE CROSS/BLUE SHIELD | Source: Ambulatory Visit | Attending: Obstetrics and Gynecology | Admitting: Obstetrics and Gynecology

## 2015-03-23 DIAGNOSIS — Z1231 Encounter for screening mammogram for malignant neoplasm of breast: Secondary | ICD-10-CM

## 2015-08-25 ENCOUNTER — Encounter: Payer: Self-pay | Admitting: Family Medicine

## 2015-08-25 ENCOUNTER — Ambulatory Visit (INDEPENDENT_AMBULATORY_CARE_PROVIDER_SITE_OTHER): Payer: BLUE CROSS/BLUE SHIELD | Admitting: Family Medicine

## 2015-08-25 VITALS — BP 106/73 | HR 101 | Temp 98.9°F | Resp 20 | Wt 165.5 lb

## 2015-08-25 DIAGNOSIS — J0100 Acute maxillary sinusitis, unspecified: Secondary | ICD-10-CM | POA: Diagnosis not present

## 2015-08-25 MED ORDER — AMOXICILLIN-POT CLAVULANATE 875-125 MG PO TABS: 1.0000 | ORAL_TABLET | Freq: Two times a day (BID) | ORAL | Status: DC

## 2015-08-25 NOTE — Progress Notes (Signed)
Patient ID: Tracy Moore, female   DOB: 11-15-1960, 55 y.o.   MRN: [id number]    Tracy Moore , January 22, 1961, 56 y.o., female MRN: [id number]  CC: cough Subjective: Pt presents for an acute OV with complaints of coughof 4 days duration. Associated symptoms include PND, sore throat, dry cough/deep started to become worse, chest burning from cough, headache, facial pressure and teeth pain, chills, decreased appetite, fever (102). Pt has tried motrin/tyelnol to ease their symptoms. Son is sick, with high fever and rash.  UTD with flu and tdap. No asthma history.    No Known Allergies Social History  Substance Use Topics  . Smoking status: Never Smoker   . Smokeless tobacco: Never Used  . Alcohol Use: 0.0 oz/week    0 Standard drinks or equivalent per week     Comment: socially   Past Medical History  Diagnosis Date  . Melanoma ([place]) 08/2006    Righ thigh (Dr. Nevada Crane)  . Depression   . Thyroid nodule 12/1998    Bx benign.Dr. Dwyane Dee following q1-2 yrs.  Pt has been euthyroid (was on synthroid at one point in time but this was d/c'd per pt)  . Hay fever     spring/fall  . Varicose veins   . Allergy     seasonal   Past Surgical History  Procedure Laterality Date  . Cesarean section  1997 and 2003  . Knee arthroscopy Left 1992  . Thyroid nodule biopsy  2000    Benign  . Anal fissure repair     Family History  Problem Relation Age of Onset  . Arthritis Mother   . Hypertension Father   . Arthritis Father   . Heart disease Father   . Thyroid disease Father   . Autism Son   . Dementia Maternal Grandmother   . Cancer Maternal Grandfather     prostate  . Cancer Paternal Grandfather     pancreatic cancer  . Colon cancer Paternal Grandfather 13     Medication List       This list is accurate as of: 08/25/15  9:59 AM.  Always use your most recent med list.               5-HTP 100 MG Caps  Take by mouth.     acetaminophen 325 MG tablet  Commonly known as:  TYLENOL    Take 650 mg by mouth every 6 (six) hours as needed.     BIOTIN PO  Take 1,000 mg by mouth daily.     CALCIUM PLUS VITAMIN D3 600-500 MG-UNIT Caps  Generic drug:  Calcium Carb-Cholecalciferol  Take 2 tablets by mouth daily. Reported on 08/25/2015     ibuprofen 200 MG tablet  Commonly known as:  ADVIL,MOTRIN  Take 200 mg by mouth every 6 (six) hours as needed.     levonorgestrel 20 MCG/24HR IUD  Commonly known as:  MIRENA  1 each by Intrauterine route once.     SM SUPER B COMPLEX/C PO  Take 1 tablet by mouth daily.     SUDAFED 12 HOUR PO  Take 1 tablet by mouth as needed.        ROS: Negative, with the exception of above mentioned in HPI  Objective:  BP 106/73 mmHg  Pulse 101  Temp(Src) 98.9 F (37.2 C)  Resp 20  Wt 165 lb 8 oz (75.07 kg)  SpO2 97% Body mass index is 26.73 kg/(m^2). Gen: Afebrile. No acute distress. Nontoxic in appearance.  Well-developed, well-nourished female. HENT: AT. Tracy Moore. Bilateral TM visualized, full/fluid level, no erythema or bulging. MMM, no oral lesions. Bilateral nares erythema, swelling and excoriation/.blood. Throat without erythema or exudates. Mild cough on exam, no hoarseness on exam. Tenderness to palpation bilateral maxillary sinuses Eyes:Pupils Equal Round Reactive to light, Extraocular movements intact,  Conjunctiva without redness, discharge or icterus. Neck/lymp/endocrine: Supple, anterior cervical lymphadenopathy CV: Mild tachycardia, otherwise regular rhythm (Sudafed use), no edema Chest: CTAB, no wheeze or crackles. Good air movement, normal resp effort.  Abd: Soft. NTND. BS present. Skin: No rashes, purpura or petechiae.  Neuro: Normal gait. PERLA. EOMi. Alert. Oriented x3  Assessment/Plan: Tracy Moore is a 55 y.o. female present for acute OV for  1. Acute maxillary sinusitis, recurrence not specified - amoxicillin-clavulanate (AUGMENTIN) 875-125 MG tablet; Take 1 tablet by mouth 2 (two) times daily.  Dispense: 20 tablet;  Refill: 0 - Dc Sudafed - mucinex or robitussin DM, rest and hydrate - flonase  - F/U Prn   Renee Kuneff, DO  [facility]

## 2015-08-25 NOTE — Patient Instructions (Signed)
Discontinue  pseudofed Start mucinex or robtissum DM Augmentin 10 day prescribed Purchase flonase , use for 2- 4 weeks.  Sinusitis, Adult Sinusitis is redness, soreness, and inflammation of the paranasal sinuses. Paranasal sinuses are air pockets within the bones of your face. They are located beneath your eyes, in the middle of your forehead, and above your eyes. In healthy paranasal sinuses, mucus is able to drain out, and air is able to circulate through them by way of your nose. However, when your paranasal sinuses are inflamed, mucus and air can become trapped. This can allow bacteria and other germs to grow and cause infection. Sinusitis can develop quickly and last only a short time (acute) or continue over a long period (chronic). Sinusitis that lasts for more than 12 weeks is considered chronic. CAUSES Causes of sinusitis include:  Allergies.  Structural abnormalities, such as displacement of the cartilage that separates your nostrils (deviated septum), which can decrease the air flow through your nose and sinuses and affect sinus drainage.  Functional abnormalities, such as when the small hairs (cilia) that line your sinuses and help remove mucus do not work properly or are not present. SIGNS AND SYMPTOMS Symptoms of acute and chronic sinusitis are the same. The primary symptoms are pain and pressure around the affected sinuses. Other symptoms include:  Upper toothache.  Earache.  Headache.  Bad breath.  Decreased sense of smell and taste.  A cough, which worsens when you are lying flat.  Fatigue.  Fever.  Thick drainage from your nose, which often is green and may contain pus (purulent).  Swelling and warmth over the affected sinuses. DIAGNOSIS Your health care provider will perform a physical exam. During your exam, your health care provider may perform any of the following to help determine if you have acute sinusitis or chronic sinusitis:  Look in your nose for  signs of abnormal growths in your nostrils (nasal polyps).  Tap over the affected sinus to check for signs of infection.  View the inside of your sinuses using an imaging device that has a light attached (endoscope). If your health care provider suspects that you have chronic sinusitis, one or more of the following tests may be recommended:  Allergy tests.  Nasal culture. A sample of mucus is taken from your nose, sent to a lab, and screened for bacteria.  Nasal cytology. A sample of mucus is taken from your nose and examined by your health care provider to determine if your sinusitis is related to an allergy. TREATMENT Most cases of acute sinusitis are related to a viral infection and will resolve on their own within 10 days. Sometimes, medicines are prescribed to help relieve symptoms of both acute and chronic sinusitis. These may include pain medicines, decongestants, nasal steroid sprays, or saline sprays. However, for sinusitis related to a bacterial infection, your health care provider will prescribe antibiotic medicines. These are medicines that will help kill the bacteria causing the infection. Rarely, sinusitis is caused by a fungal infection. In these cases, your health care provider will prescribe antifungal medicine. For some cases of chronic sinusitis, surgery is needed. Generally, these are cases in which sinusitis recurs more than 3 times per year, despite other treatments. HOME CARE INSTRUCTIONS  Drink plenty of water. Water helps thin the mucus so your sinuses can drain more easily.  Use a humidifier.  Inhale steam 3-4 times a day (for example, sit in the bathroom with the shower running).  Apply a warm, moist washcloth to  your face 3-4 times a day, or as directed by your health care provider.  Use saline nasal sprays to help moisten and clean your sinuses.  Take medicines only as directed by your health care provider.  If you were prescribed either an antibiotic or  antifungal medicine, finish it all even if you start to feel better. SEEK IMMEDIATE MEDICAL CARE IF:  You have increasing pain or severe headaches.  You have nausea, vomiting, or drowsiness.  You have swelling around your face.  You have vision problems.  You have a stiff neck.  You have difficulty breathing.   This information is not intended to replace advice given to you by your health care provider. Make sure you discuss any questions you have with your health care provider.   Document Released: 06/26/2005 Document Revised: 07/17/2014 Document Reviewed: 07/11/2011 Elsevier Interactive Patient Education [employer].

## 2016-04-24 ENCOUNTER — Other Ambulatory Visit: Payer: Self-pay | Admitting: Obstetrics and Gynecology

## 2016-04-24 DIAGNOSIS — Z1231 Encounter for screening mammogram for malignant neoplasm of breast: Secondary | ICD-10-CM

## 2016-05-24 ENCOUNTER — Encounter: Payer: Self-pay | Admitting: Family Medicine

## 2016-05-24 ENCOUNTER — Ambulatory Visit
Admission: RE | Admit: 2016-05-24 | Discharge: 2016-05-24 | Disposition: A | Discharge status: Home / Self Care | Payer: BLUE CROSS/BLUE SHIELD | Source: Ambulatory Visit | Attending: Obstetrics and Gynecology | Admitting: Obstetrics and Gynecology

## 2016-05-24 ENCOUNTER — Ambulatory Visit (INDEPENDENT_AMBULATORY_CARE_PROVIDER_SITE_OTHER): Payer: BLUE CROSS/BLUE SHIELD | Admitting: Family Medicine

## 2016-05-24 VITALS — BP 94/67 | HR 69 | Temp 97.3°F | Resp 16 | Ht 66.5 in | Wt 173.8 lb

## 2016-05-24 DIAGNOSIS — E041 Nontoxic single thyroid nodule: Secondary | ICD-10-CM

## 2016-05-24 DIAGNOSIS — Z1231 Encounter for screening mammogram for malignant neoplasm of breast: Secondary | ICD-10-CM

## 2016-05-24 DIAGNOSIS — Z0000 Encounter for general adult medical examination without abnormal findings: Secondary | ICD-10-CM | POA: Diagnosis not present

## 2016-05-24 LAB — COMPREHENSIVE METABOLIC PANEL
ALBUMIN: 4.3 g/dL (ref 3.5–5.2)
ALK PHOS: 64 U/L (ref 39–117)
ALT: 21 U/L (ref 0–35)
AST: 17 U/L (ref 0–37)
BILIRUBIN TOTAL: 0.5 mg/dL (ref 0.2–1.2)
BUN: 13 mg/dL (ref 6–23)
CO2: 28 mEq/L (ref 19–32)
CREATININE: 0.8 mg/dL (ref 0.40–1.20)
Calcium: 9.4 mg/dL (ref 8.4–10.5)
Chloride: 107 mEq/L (ref 96–112)
GFR: 79.54 mL/min (ref >60.00)
Glucose, Bld: 84 mg/dL (ref 70–99)
Potassium: 4.4 mEq/L (ref 3.5–5.1)
SODIUM: 142 meq/L (ref 135–145)
TOTAL PROTEIN: 7.2 g/dL (ref 6.0–8.3)

## 2016-05-24 LAB — LIPID PANEL
CHOLESTEROL: 222 mg/dL — AB (ref 0–200)
HDL: 47.2 mg/dL (ref >39.00)
LDL Cholesterol: 145 mg/dL — ABNORMAL HIGH (ref 0–99)
NonHDL: 174.85
Total CHOL/HDL Ratio: 5
Triglycerides: 150 mg/dL — ABNORMAL HIGH (ref 0.0–149.0)
VLDL: 30 mg/dL (ref 0.0–40.0)

## 2016-05-24 LAB — CBC WITH DIFFERENTIAL/PLATELET
BASOS PCT: 1 % (ref 0.0–3.0)
Basophils Absolute: 0 10*3/uL (ref 0.0–0.1)
EOS ABS: 0.1 10*3/uL (ref 0.0–0.7)
Eosinophils Relative: 1.3 % (ref 0.0–5.0)
HCT: 40.3 % (ref 36.0–46.0)
HEMOGLOBIN: 13.5 g/dL (ref 12.0–15.0)
Lymphocytes Relative: 39.4 % (ref 12.0–46.0)
Lymphs Abs: 1.9 10*3/uL (ref 0.7–4.0)
MCHC: 33.5 g/dL (ref 30.0–36.0)
MCV: 90.7 fl (ref 78.0–100.0)
MONO ABS: 0.4 10*3/uL (ref 0.1–1.0)
Monocytes Relative: 8 % (ref 3.0–12.0)
Neutro Abs: 2.4 10*3/uL (ref 1.4–7.7)
Neutrophils Relative %: 50.3 % (ref 43.0–77.0)
PLATELETS: 324 10*3/uL (ref 150.0–400.0)
RBC: 4.44 Mil/uL (ref 3.87–5.11)
RDW: 13.7 % (ref 11.5–15.5)
WBC: 4.8 10*3/uL (ref 4.0–10.5)

## 2016-05-24 LAB — T4, FREE: FREE T4: 0.8 ng/dL (ref 0.60–1.60)

## 2016-05-24 LAB — TSH: TSH: 0.67 u[IU]/mL (ref 0.35–4.50)

## 2016-05-24 NOTE — Progress Notes (Signed)
Office Note 05/24/2016  CC:  Chief Complaint  Patient presents with  . Annual Exam    CPE   HPI:  Tracy Moore is a 55 y.o. White female who is here for annual health maintenance exam. Pt sees GYN, got pap 11/2015. She got flu vaccine 03/2016.  She plans on routine f/u with Dr. Dwyane Dee for hx of thyroid nodule, asks me to check thyroid labs today. Diet is ok; rare exercise, walks a couple times per week. Had some R sided sciatica this summer that is improved significantly with chiropracter.  Past Medical History:  Diagnosis Date  . Allergy    seasonal  . Depression   . Hay fever    spring/fall  . Melanoma ([place]) 08/2006   Righ thigh (Dr. Nevada Crane)  . Thyroid nodule 12/1998   Bx benign.Dr. Dwyane Dee following q1-2 yrs.  Pt has been euthyroid (was on synthroid at one point in time but this was d/c'd per pt)  . Varicose veins     Past Surgical History:  Procedure Laterality Date  . ANAL FISSURE REPAIR    . [place] and 2003  . COLONOSCOPY W/ POLYPECTOMY  08/07/2014   Sessile serrated polyp x 1.  Recall 5 yrs  . KNEE ARTHROSCOPY Left 1992  . thyroid nodule biopsy  2000   Benign    Family History  Problem Relation Age of Onset  . Arthritis Mother   . Hypertension Father   . Arthritis Father   . Heart disease Father   . Thyroid disease Father   . Autism Son   . Dementia Maternal Grandmother   . Cancer Maternal Grandfather     prostate  . Cancer Paternal Grandfather     pancreatic cancer  . Colon cancer Paternal Grandfather 70    Social History   Social History  . Marital status: Married    Spouse name: N/A  . Number of children: N/A  . Years of education: N/A   Occupational History  . Not on file.   Social History Main Topics  . Smoking status: Never Smoker  . Smokeless tobacco: Never Used  . Alcohol use 0.0 oz/week     Comment: socially  . Drug use: No  . Sexual activity: Not on file   Other Topics Concern  . Not on file   Social  History Narrative   Married, daughter 67 y/o , son 53 y/o (has autism).   Lives in [place].   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  No drugs.    Outpatient Medications Prior to Visit  Medication Sig Dispense Refill  . 5-Hydroxytryptophan (5-HTP) 100 MG CAPS Take by mouth.    Marland Kitchen acetaminophen (TYLENOL) 325 MG tablet Take 650 mg by mouth every 6 (six) hours as needed.    Marland Kitchen BIOTIN PO Take 1,000 mg by mouth daily.    Marland Kitchen ibuprofen (ADVIL,MOTRIN) 200 MG tablet Take 200 mg by mouth every 6 (six) hours as needed.    Marland Kitchen levonorgestrel (MIRENA) 20 MCG/24HR IUD 1 each by Intrauterine route once.    . SM SUPER B COMPLEX/C PO Take 1 tablet by mouth daily.    . Calcium Carb-Cholecalciferol (CALCIUM PLUS VITAMIN D3) 600-500 MG-UNIT CAPS Take 2 tablets by mouth daily. Reported on 08/25/2015    . Pseudoephedrine HCl (SUDAFED 12 HOUR PO) Take 1 tablet by mouth as needed.    Marland Kitchen amoxicillin-clavulanate (AUGMENTIN) 875-125 MG tablet Take 1 tablet by mouth  2 (two) times daily. 20 tablet 0   No facility-administered medications prior to visit.     No Known Allergies  ROS Review of Systems  Constitutional: Negative for appetite change, chills, fatigue and fever.  HENT: Negative for congestion, dental problem, ear pain and sore throat.   Eyes: Negative for discharge, redness and visual disturbance.  Respiratory: Negative for cough, chest tightness, shortness of breath and wheezing.   Cardiovascular: Negative for chest pain, palpitations and leg swelling.  Gastrointestinal: Negative for abdominal pain, blood in stool, diarrhea, nausea and vomiting.  Genitourinary: Negative for difficulty urinating, dysuria, flank pain, frequency, hematuria and urgency.  Musculoskeletal: Negative for arthralgias, back pain, joint swelling, myalgias and neck stiffness.  Skin: Negative for pallor and rash.  Neurological: Negative for dizziness, speech difficulty, weakness and headaches.   Hematological: Negative for adenopathy. Does not bruise/bleed easily.  Psychiatric/Behavioral: Negative for confusion and sleep disturbance. The patient is not nervous/anxious.     PE; Blood pressure 94/67, pulse 69, temperature 97.3 F (36.3 C), temperature source Temporal, resp. rate 16, height 5' 6.5" (1.689 m), weight 173 lb 12.8 oz (78.8 kg), SpO2 95 %. Gen: Alert, well appearing.  Patient is oriented to person, place, time, and situation. AFFECT: pleasant, lucid thought and speech. ENT: Ears: EACs clear, normal epithelium.  TMs with good light reflex and landmarks bilaterally.  Eyes: no injection, icteris, swelling, or exudate.  EOMI, PERRLA. Nose: no drainage or turbinate edema/swelling.  No injection or focal lesion.  Mouth: lips without lesion/swelling.  Oral mucosa pink and moist.  Dentition intact and without obvious caries or gingival swelling.  Oropharynx without erythema, exudate, or swelling.  Neck: supple/nontender.  No LAD, mass.  I can feel her L thyroid lobe better than her right.  Carotid pulses 2+ bilaterally, without bruits. CV: RRR, no m/r/g.   LUNGS: CTA bilat, nonlabored resps, good aeration in all lung fields. ABD: soft, NT, ND, BS normal.  No hepatospenomegaly or mass.  No bruits. EXT: no clubbing, cyanosis, or edema.  Musculoskeletal: no joint swelling, erythema, warmth, or tenderness.  ROM of all joints intact. Skin - no sores or suspicious lesions or rashes or color changes   Pertinent labs:  Lab Results  Component Value Date   TSH 0.76 10/06/2013   Lab Results  Component Value Date   WBC 5.0 03/04/2014   HGB 12.8 03/04/2014   HCT 38.6 03/04/2014   MCV 92.2 03/04/2014   PLT 309.0 03/04/2014   Lab Results  Component Value Date   CREATININE 0.8 03/04/2014   BUN 13 03/04/2014   NA 138 03/04/2014   K 4.3 03/04/2014   CL 104 03/04/2014   CO2 28 03/04/2014   Lab Results  Component Value Date   ALT 28 03/04/2014   AST 21 03/04/2014   ALKPHOS 66  03/04/2014   BILITOT 0.7 03/04/2014   Lab Results  Component Value Date   CHOL 227 (H) 03/04/2014   Lab Results  Component Value Date   HDL 48.00 03/04/2014   Lab Results  Component Value Date   LDLCALC 150 (H) 03/04/2014   Lab Results  Component Value Date   TRIG 143.0 03/04/2014   Lab Results  Component Value Date   CHOLHDL 5 03/04/2014    ASSESSMENT AND PLAN:   Health maintenance exam: Reviewed age and gender appropriate health maintenance issues (prudent diet, regular exercise, health risks of tobacco and excessive alcohol, use of seatbelts, fire alarms in home, use of sunscreen).  Also reviewed age  and gender appropriate health screening as well as vaccine recommendations. Flu vaccine UTD. Colon ca screening: UTD, next colonoscopy due 2021. Cerv ca scr and breast ca screening: UTD. Fasting HP labs drawn today. Added T4 and T3 for hx of thyroid nodule.  She will set up routine f/u for this with Dr. Dwyane Dee at her earliest convenience.  An After Visit Summary was printed and given to the patient.  FOLLOW UP:  Return in about 1 year (around 05/24/2017) for annual CPE (fasting).  An After Visit Summary was printed and given to the patient.

## 2016-05-24 NOTE — Patient Instructions (Signed)

## 2016-05-24 NOTE — Progress Notes (Signed)
Pre visit review using our clinic review tool, if applicable. No additional management support is needed unless otherwise documented below in the visit note. 

## 2016-05-25 LAB — T3: T3 TOTAL: 110 ng/dL (ref 76–181)

## 2016-07-14 ENCOUNTER — Ambulatory Visit (INDEPENDENT_AMBULATORY_CARE_PROVIDER_SITE_OTHER): Payer: BLUE CROSS/BLUE SHIELD | Admitting: Family Medicine

## 2016-07-14 ENCOUNTER — Encounter: Payer: Self-pay | Admitting: Family Medicine

## 2016-07-14 DIAGNOSIS — J0100 Acute maxillary sinusitis, unspecified: Secondary | ICD-10-CM | POA: Diagnosis not present

## 2016-07-14 MED ORDER — AMOXICILLIN-POT CLAVULANATE 875-125 MG PO TABS: 1.0000 | ORAL_TABLET | Freq: Two times a day (BID) | ORAL | 0 refills | Status: DC

## 2016-07-14 NOTE — Progress Notes (Signed)
Tracy Moore , 1961-04-26, 56 y.o., female MRN: [id number] Patient Care Team    Relationship Specialty Notifications Start End  Tammi Sou, MD PCP - General Family Medicine  09/08/13   Elayne Snare, MD Consulting Physician Endocrinology  09/29/13   Linus Mako, MD Consulting Physician Family Medicine  10/07/13   Jerene Bears, MD Consulting Physician Gastroenterology  05/24/16     CC: Cough Subjective: Pt presents for an acute OV with complaints of cough of 2 weeks duration.  Associated symptoms include sinus pressure, postnasal drip, dry cough, headache, sinus pressure, teeth aching. Patient states that she has tried Sudafed and Mucinex to help with her symptoms. She denies nausea, vomit, diarrhea, fever, chills or shortness of breath.  No Known Allergies Social History  Substance Use Topics  . Smoking status: Never Smoker  . Smokeless tobacco: Never Used  . Alcohol use 0.0 oz/week     Comment: socially   Past Medical History:  Diagnosis Date  . Allergy    seasonal  . Depression   . Hay fever    spring/fall  . Melanoma ([place]) 08/2006   Righ thigh (Dr. [place] Crane)  . Thyroid nodule 12/1998   Bx benign.Dr. Dwyane Dee following q1-2 yrs.  Pt has been euthyroid (was on synthroid at one point in time but this was d/c'd per pt)  . Varicose veins    Past Surgical History:  Procedure Laterality Date  . ANAL FISSURE REPAIR    . [place] and 2003  . COLONOSCOPY W/ POLYPECTOMY  08/07/2014   Sessile serrated polyp x 1.  Recall 5 yrs  . KNEE ARTHROSCOPY Left 1992  . thyroid nodule biopsy  2000   Benign   Family History  Problem Relation Age of Onset  . Arthritis Mother   . Hypertension Father   . Arthritis Father   . Heart disease Father   . Thyroid disease Father   . Autism Son   . Dementia Maternal Grandmother   . Cancer Maternal Grandfather     prostate  . Cancer Paternal Grandfather     pancreatic cancer  . Colon cancer Paternal Grandfather 54    Allergies as of 07/14/2016   No Known Allergies     Medication List       Accurate as of 07/14/16  3:20 PM. Always use your most recent med list.          5-HTP 100 MG Caps Take by mouth.   acetaminophen 325 MG tablet Commonly known as:  TYLENOL Take 650 mg by mouth every 6 (six) hours as needed.   BIOTIN PO Take 1,000 mg by mouth daily.   CALCIUM PLUS VITAMIN D3 600-500 MG-UNIT Caps Generic drug:  Calcium Carb-Cholecalciferol Take 2 tablets by mouth daily. Reported on 08/25/2015   ibuprofen 200 MG tablet Commonly known as:  ADVIL,MOTRIN Take 200 mg by mouth every 6 (six) hours as needed.   levonorgestrel 20 MCG/24HR IUD Commonly known as:  MIRENA 1 each by Intrauterine route once.   SM SUPER B COMPLEX/C PO Take 1 tablet by mouth daily.       No results found for this or any previous visit (from the past 24 hour(s)). No results found.   ROS: Negative, with the exception of above mentioned in HPI   Objective:  BP 116/75 (BP Location: Left Arm, Patient Position: Sitting, Cuff Size: Large)   Pulse 72   Temp 98.3 F (36.8 C) (Oral)   Resp 16  Wt 177 lb 4 oz (80.4 kg)   SpO2 96%   BMI 28.18 kg/m  Body mass index is 28.18 kg/m. Gen: Afebrile. No acute distress. Nontoxic in appearance, well developed, well nourished. Appears fatigued. HENT: AT. [place]. Bilateral TM visualized within normal limits. MMM, no oral lesions. Bilateral nares erythema, swelling and drainage. Throat without erythema or exudates. Postnasal drip present, cough present, tender to palpation maxillary sinus. Eyes:Pupils Equal Round Reactive to light, Extraocular movements intact,  Conjunctiva without redness, discharge or icterus. Neck/lymp/endocrine: Supple, no lymphadenopathy CV: RRR  Chest: CTAB, no wheeze or crackles. Good air movement, normal resp effort.  Abd: Soft. NTND. BS present.  Assessment/Plan: Tracy Moore is a 56 y.o. female present for acute OV for  1. Acute maxillary  sinusitis, recurrence not specified - Rest, hydrate, Flonase, Mucinex, nasal saline. Augmentin prescribed. - amoxicillin-clavulanate (AUGMENTIN) 875-125 MG tablet; Take 1 tablet by mouth 2 (two) times daily.  Dispense: 20 tablet; Refill: 0 - Follow-up when necessary  electronically signed by:  Howard Pouch, DO  [place]

## 2016-07-14 NOTE — Progress Notes (Signed)
Pre visit review using our clinic review tool, if applicable. No additional management support is needed unless otherwise documented below in the visit note. 

## 2016-07-14 NOTE — Patient Instructions (Signed)
Rest. Hydrate. Flonase. Nasal saline. Plain mucinex. Augmentin prescribed every 12 hours for 10 days.    Sinusitis, Adult Sinusitis is soreness and inflammation of your sinuses. Sinuses are hollow spaces in the bones around your face. They are located:  Around your eyes.  In the middle of your forehead.  Behind your nose.  In your cheekbones. Your sinuses and nasal passages are lined with a stringy fluid (mucus). Mucus normally drains out of your sinuses. When your nasal tissues get inflamed or swollen, the mucus can get trapped or blocked so air cannot flow through your sinuses. This lets bacteria, viruses, and funguses grow, and that leads to infection. Follow these instructions at home: Medicines  Take, use, or apply over-the-counter and prescription medicines only as told by your doctor. These may include nasal sprays.  If you were prescribed an antibiotic medicine, take it as told by your doctor. Do not stop taking the antibiotic even if you start to feel better. Hydrate and Humidify  Drink enough water to keep your pee (urine) clear or pale yellow.  Use a cool mist humidifier to keep the humidity level in your home above 50%.  Breathe in steam for 10-15 minutes, 3-4 times a day or as told by your doctor. You can do this in the bathroom while a hot shower is running.  Try not to spend time in cool or dry air. Rest  Rest as much as possible.  Sleep with your head raised (elevated).  Make sure to get enough sleep each night. General instructions  Put a warm, moist washcloth on your face 3-4 times a day or as told by your doctor. This will help with discomfort.  Wash your hands often with soap and water. If there is no soap and water, use hand sanitizer.  Do not smoke. Avoid being around people who are smoking (secondhand smoke).  Keep all follow-up visits as told by your doctor. This is important. Contact a doctor if:  You have a fever.  Your symptoms get  worse.  Your symptoms do not get better within 10 days. Get help right away if:  You have a very bad headache.  You cannot stop throwing up (vomiting).  You have pain or swelling around your face or eyes.  You have trouble seeing.  You feel confused.  Your neck is stiff.  You have trouble breathing. This information is not intended to replace advice given to you by your health care provider. Make sure you discuss any questions you have with your health care provider. Document Released: 12/13/2007 Document Revised: 02/20/2016 Document Reviewed: 04/21/2015 Elsevier Interactive Patient Education  2017 [employer].

## 2016-07-19 ENCOUNTER — Telehealth: Payer: Self-pay | Admitting: Family Medicine

## 2016-07-19 NOTE — Telephone Encounter (Signed)
Patient Name: Tracy Moore  DOB: February 24, 1961    Initial Comment Caller states that she was seen in the office last Friday for a sinus infection. She is taking an antibiotic twice a day, but she still has sinus headache, blowing her nose, and congestion. She wants to know if she should be taking something else to help with her symptoms.    Nurse Assessment  Nurse: Thad Ranger RN, Denise Date/Time (Eastern Time): 07/19/2016 3:34:48 PM  Confirm and document reason for call. If symptomatic, describe symptoms. ---Caller states that she was seen in the office last Friday for a sinus infection. She is taking an antibiotic twice a day, but she still has sinus headache, blowing her nose, and congestion. She wants to know if she should be taking something else to help with her symptoms. Taking Motrin for the HA.  Does the patient have any new or worsening symptoms? ---Yes  Will a triage be completed? ---Yes  Related visit to physician within the last 2 weeks? ---Yes  Does the PT have any chronic conditions? (i.e. diabetes, asthma, etc.) ---No  Is the patient pregnant or possibly pregnant? (Ask all females between the ages of 64-55) ---No  Is this a behavioral health or substance abuse call? ---No     Guidelines    Guideline Title Affirmed Question Affirmed Notes  Sinus Infection on Antibiotic Follow-up Call [1] Taking antibiotic AND [2] nose still blocked (all triage questions negative)    Final Disposition User   [place], RN, Langley Gauss    Comments  Based on RN clinical knowledge, advised to push water, 6-10, 8 oz water/day.   Disagree/Comply: Comply

## 2016-08-04 DIAGNOSIS — M9903 Segmental and somatic dysfunction of lumbar region: Secondary | ICD-10-CM | POA: Diagnosis not present

## 2016-08-04 DIAGNOSIS — M9901 Segmental and somatic dysfunction of cervical region: Secondary | ICD-10-CM | POA: Diagnosis not present

## 2016-08-04 DIAGNOSIS — M542 Cervicalgia: Secondary | ICD-10-CM | POA: Diagnosis not present

## 2016-08-04 DIAGNOSIS — M609 Myositis, unspecified: Secondary | ICD-10-CM | POA: Diagnosis not present

## 2016-08-04 DIAGNOSIS — M5431 Sciatica, right side: Secondary | ICD-10-CM | POA: Diagnosis not present

## 2016-08-04 DIAGNOSIS — M9904 Segmental and somatic dysfunction of sacral region: Secondary | ICD-10-CM | POA: Diagnosis not present

## 2016-08-04 DIAGNOSIS — M530 Cervicocranial syndrome: Secondary | ICD-10-CM | POA: Diagnosis not present

## 2016-08-04 DIAGNOSIS — M9902 Segmental and somatic dysfunction of thoracic region: Secondary | ICD-10-CM | POA: Diagnosis not present

## 2016-08-04 DIAGNOSIS — M5136 Other intervertebral disc degeneration, lumbar region: Secondary | ICD-10-CM | POA: Diagnosis not present

## 2016-08-08 ENCOUNTER — Ambulatory Visit (INDEPENDENT_AMBULATORY_CARE_PROVIDER_SITE_OTHER): Payer: BLUE CROSS/BLUE SHIELD | Admitting: Endocrinology

## 2016-08-08 ENCOUNTER — Encounter: Payer: Self-pay | Admitting: Endocrinology

## 2016-08-08 VITALS — BP 100/50 | HR 64 | Ht 67.0 in | Wt 169.0 lb

## 2016-08-08 DIAGNOSIS — E041 Nontoxic single thyroid nodule: Secondary | ICD-10-CM

## 2016-08-08 NOTE — Progress Notes (Signed)
Patient ID: Tracy Moore, female   DOB: 05/14/1961, 56 y.o.   MRN: [id number]    Reason for Appointment: Followup of thyroid nodule     History of Present Illness:   The patient's thyroid nodule was first discovered in the year 2000 incidentally by her gynecologist Initially the nodule was 14 mm in size. Biopsy was done soon after diagnosis and showed mostly colloid with a few benign follicular cells Also previously had negative thyroid antibodies  Clinically the nodule measured about 2 cm in 2003 She was also tried on thyroid suppression for sometime but this caused relatively low TSH  She was last seen in 2015 She thinks she can feel her nodule but not sure if it is changed  She has had no difficulty with swallowing   Does not feel like she has any choking sensation in her neck or pressure in any position   Lab Results  Component Value Date   TSH 0.67 05/24/2016   No visits with results within 1 Week(s) from this visit.  Latest known visit with results is:  Office Visit on 05/24/2016  Component Date Value Ref Range Status  . WBC 05/24/2016 4.8  4.0 - 10.5 K/uL Final  . RBC 05/24/2016 4.44  3.87 - 5.11 Mil/uL Final  . Hemoglobin 05/24/2016 13.5  12.0 - 15.0 g/dL Final  . HCT 05/24/2016 40.3  36.0 - 46.0 % Final  . MCV 05/24/2016 90.7  78.0 - 100.0 fl Final  . MCHC 05/24/2016 33.5  30.0 - 36.0 g/dL Final  . RDW 05/24/2016 13.7  11.5 - 15.5 % Final  . Platelets 05/24/2016 324.0  150.0 - 400.0 K/uL Final  . Neutrophils Relative % 05/24/2016 50.3  43.0 - 77.0 % Final  . Lymphocytes Relative 05/24/2016 39.4  12.0 - 46.0 % Final  . Monocytes Relative 05/24/2016 8.0  3.0 - 12.0 % Final  . Eosinophils Relative 05/24/2016 1.3  0.0 - 5.0 % Final  . Basophils Relative 05/24/2016 1.0  0.0 - 3.0 % Final  . Neutro Abs 05/24/2016 2.4  1.4 - 7.7 K/uL Final  . Lymphs Abs 05/24/2016 1.9  0.7 - 4.0 K/uL Final  . Monocytes Absolute 05/24/2016 0.4  0.1 - 1.0 K/uL Final  . Eosinophils  Absolute 05/24/2016 0.1  0.0 - 0.7 K/uL Final  . Basophils Absolute 05/24/2016 0.0  0.0 - 0.1 K/uL Final  . TSH 05/24/2016 0.67  0.35 - 4.50 uIU/mL Final  . Cholesterol 05/24/2016 222* 0 - 200 mg/dL Final  . Triglycerides 05/24/2016 150.0* 0.0 - 149.0 mg/dL Final  . HDL 05/24/2016 47.20  >39.00 mg/dL Final  . VLDL 05/24/2016 30.0  0.0 - 40.0 mg/dL Final  . LDL Cholesterol 05/24/2016 145* 0 - 99 mg/dL Final  . Total CHOL/HDL Ratio 05/24/2016 5   Final  . NonHDL 05/24/2016 174.85   Final  . Sodium 05/24/2016 142  135 - 145 mEq/L Final  . Potassium 05/24/2016 4.4  3.5 - 5.1 mEq/L Final  . Chloride 05/24/2016 107  96 - 112 mEq/L Final  . CO2 05/24/2016 28  19 - 32 mEq/L Final  . Glucose, Bld 05/24/2016 84  70 - 99 mg/dL Final  . BUN 05/24/2016 13  6 - 23 mg/dL Final  . Creatinine, Ser 05/24/2016 0.80  0.40 - 1.20 mg/dL Final  . Total Bilirubin 05/24/2016 0.5  0.2 - 1.2 mg/dL Final  . Alkaline Phosphatase 05/24/2016 64  39 - 117 U/L Final  . AST 05/24/2016 17  0 - 37  U/L Final  . ALT 05/24/2016 21  0 - 35 U/L Final  . Total Protein 05/24/2016 7.2  6.0 - 8.3 g/dL Final  . Albumin 05/24/2016 4.3  3.5 - 5.2 g/dL Final  . Calcium 05/24/2016 9.4  8.4 - 10.5 mg/dL Final  . GFR 05/24/2016 79.54  >60.00 mL/min Final  . T3, Total 05/24/2016 110.0  76 - 181 ng/dL Final  . Free T4 05/24/2016 0.80  0.60 - 1.60 ng/dL Final   Comment: Specimens from patients who are undergoing biotin therapy and /or ingesting biotin supplements may contain high levels of biotin.  The higher biotin concentration in these specimens interferes with this Free T4 assay.  Specimens that contain high levels  of biotin may cause false high results for this Free T4 assay.  Please interpret results in light of the total clinical presentation of the patient.       Allergies as of 08/08/2016   No Known Allergies     Medication List       Accurate as of 08/08/16 11:35 AM. Always use your most recent med list.           5-HTP 100 MG Caps Take by mouth.   acetaminophen 325 MG tablet Commonly known as:  TYLENOL Take 650 mg by mouth every 6 (six) hours as needed.   amoxicillin-clavulanate 875-125 MG tablet Commonly known as:  AUGMENTIN Take 1 tablet by mouth 2 (two) times daily.   BIOTIN PO Take 1,000 mg by mouth daily.   CALCIUM PLUS VITAMIN D3 600-500 MG-UNIT Caps Generic drug:  Calcium Carb-Cholecalciferol Take 2 tablets by mouth daily. Reported on 08/25/2015   ibuprofen 200 MG tablet Commonly known as:  ADVIL,MOTRIN Take 200 mg by mouth every 6 (six) hours as needed.   levonorgestrel 20 MCG/24HR IUD Commonly known as:  MIRENA 1 each by Intrauterine route once.   SM SUPER B COMPLEX/C PO Take 1 tablet by mouth daily.       Allergies: No Known Allergies  Past Medical History:  Diagnosis Date  . Allergy    seasonal  . Depression   . Hay fever    spring/fall  . Melanoma ([place]) 08/2006   Righ thigh (Dr. [place] Crane)  . Thyroid nodule 12/1998   Bx benign.Dr. Dwyane Dee following q1-2 yrs.  Pt has been euthyroid (was on synthroid at one point in time but this was d/c'd per pt)  . Varicose veins     Past Surgical History:  Procedure Laterality Date  . ANAL FISSURE REPAIR    . [place] and 2003  . COLONOSCOPY W/ POLYPECTOMY  08/07/2014   Sessile serrated polyp x 1.  Recall 5 yrs  . KNEE ARTHROSCOPY Left 1992  . thyroid nodule biopsy  2000   Benign    Family History  Problem Relation Age of Onset  . Arthritis Mother   . Hypertension Father   . Arthritis Father   . Heart disease Father   . Thyroid disease Father   . Autism Son   . Dementia Maternal Grandmother   . Cancer Maternal Grandfather     prostate  . Cancer Paternal Grandfather     pancreatic cancer  . Colon cancer Paternal Grandfather 58    Social History:  reports that she has never smoked. She has never used smokeless tobacco. She reports that she drinks alcohol. She reports that she does not use  drugs.   Review of Systems:  She Feels well overall  Examination:   BP (!) 100/50   Pulse 64   Ht 5\' 7"  (1.702 m)   Wt 169 lb (76.7 kg)   SpO2 97%   BMI 26.47 kg/m      The thyroid shows a left-sided 2-2.2 cm firm nodule, smooth.  Right side is not palpable.  No lymphadenopathy in the neck REFLEXES: at biceps are normal.    Assessment/Plan:  Long-standing solitary left-sided nodule, appears unchanged in size since 2009 This had been benign on biopsy in the year 2000   Since there is no significant change in size over the last nearly 3 years will not need further evaluation today  Will follow her every 2 years    [facility] 08/08/2016

## 2016-08-09 ENCOUNTER — Encounter: Payer: Self-pay | Admitting: Family Medicine

## 2016-08-30 DIAGNOSIS — M542 Cervicalgia: Secondary | ICD-10-CM | POA: Diagnosis not present

## 2016-08-30 DIAGNOSIS — M609 Myositis, unspecified: Secondary | ICD-10-CM | POA: Diagnosis not present

## 2016-08-30 DIAGNOSIS — M530 Cervicocranial syndrome: Secondary | ICD-10-CM | POA: Diagnosis not present

## 2016-08-30 DIAGNOSIS — M9901 Segmental and somatic dysfunction of cervical region: Secondary | ICD-10-CM | POA: Diagnosis not present

## 2016-08-30 DIAGNOSIS — M9904 Segmental and somatic dysfunction of sacral region: Secondary | ICD-10-CM | POA: Diagnosis not present

## 2016-08-30 DIAGNOSIS — M9903 Segmental and somatic dysfunction of lumbar region: Secondary | ICD-10-CM | POA: Diagnosis not present

## 2016-08-30 DIAGNOSIS — M5136 Other intervertebral disc degeneration, lumbar region: Secondary | ICD-10-CM | POA: Diagnosis not present

## 2016-08-30 DIAGNOSIS — M9902 Segmental and somatic dysfunction of thoracic region: Secondary | ICD-10-CM | POA: Diagnosis not present

## 2016-08-30 DIAGNOSIS — M5431 Sciatica, right side: Secondary | ICD-10-CM | POA: Diagnosis not present

## 2016-09-27 DIAGNOSIS — M9903 Segmental and somatic dysfunction of lumbar region: Secondary | ICD-10-CM | POA: Diagnosis not present

## 2016-09-27 DIAGNOSIS — M5431 Sciatica, right side: Secondary | ICD-10-CM | POA: Diagnosis not present

## 2016-09-27 DIAGNOSIS — M5136 Other intervertebral disc degeneration, lumbar region: Secondary | ICD-10-CM | POA: Diagnosis not present

## 2016-09-27 DIAGNOSIS — M9904 Segmental and somatic dysfunction of sacral region: Secondary | ICD-10-CM | POA: Diagnosis not present

## 2016-09-27 DIAGNOSIS — M9902 Segmental and somatic dysfunction of thoracic region: Secondary | ICD-10-CM | POA: Diagnosis not present

## 2016-09-27 DIAGNOSIS — M542 Cervicalgia: Secondary | ICD-10-CM | POA: Diagnosis not present

## 2016-09-27 DIAGNOSIS — M9901 Segmental and somatic dysfunction of cervical region: Secondary | ICD-10-CM | POA: Diagnosis not present

## 2016-09-27 DIAGNOSIS — M609 Myositis, unspecified: Secondary | ICD-10-CM | POA: Diagnosis not present

## 2016-09-27 DIAGNOSIS — M530 Cervicocranial syndrome: Secondary | ICD-10-CM | POA: Diagnosis not present

## 2016-11-03 DIAGNOSIS — M9902 Segmental and somatic dysfunction of thoracic region: Secondary | ICD-10-CM | POA: Diagnosis not present

## 2016-11-03 DIAGNOSIS — M609 Myositis, unspecified: Secondary | ICD-10-CM | POA: Diagnosis not present

## 2016-11-03 DIAGNOSIS — M5431 Sciatica, right side: Secondary | ICD-10-CM | POA: Diagnosis not present

## 2016-11-03 DIAGNOSIS — M9903 Segmental and somatic dysfunction of lumbar region: Secondary | ICD-10-CM | POA: Diagnosis not present

## 2016-11-03 DIAGNOSIS — M5136 Other intervertebral disc degeneration, lumbar region: Secondary | ICD-10-CM | POA: Diagnosis not present

## 2016-11-03 DIAGNOSIS — M530 Cervicocranial syndrome: Secondary | ICD-10-CM | POA: Diagnosis not present

## 2016-11-03 DIAGNOSIS — M9901 Segmental and somatic dysfunction of cervical region: Secondary | ICD-10-CM | POA: Diagnosis not present

## 2016-11-03 DIAGNOSIS — M9904 Segmental and somatic dysfunction of sacral region: Secondary | ICD-10-CM | POA: Diagnosis not present

## 2016-11-03 DIAGNOSIS — M542 Cervicalgia: Secondary | ICD-10-CM | POA: Diagnosis not present

## 2016-12-01 DIAGNOSIS — M9902 Segmental and somatic dysfunction of thoracic region: Secondary | ICD-10-CM | POA: Diagnosis not present

## 2016-12-01 DIAGNOSIS — M5136 Other intervertebral disc degeneration, lumbar region: Secondary | ICD-10-CM | POA: Diagnosis not present

## 2016-12-01 DIAGNOSIS — M530 Cervicocranial syndrome: Secondary | ICD-10-CM | POA: Diagnosis not present

## 2016-12-01 DIAGNOSIS — M5431 Sciatica, right side: Secondary | ICD-10-CM | POA: Diagnosis not present

## 2016-12-01 DIAGNOSIS — M609 Myositis, unspecified: Secondary | ICD-10-CM | POA: Diagnosis not present

## 2016-12-01 DIAGNOSIS — M9903 Segmental and somatic dysfunction of lumbar region: Secondary | ICD-10-CM | POA: Diagnosis not present

## 2016-12-01 DIAGNOSIS — M542 Cervicalgia: Secondary | ICD-10-CM | POA: Diagnosis not present

## 2016-12-01 DIAGNOSIS — M9901 Segmental and somatic dysfunction of cervical region: Secondary | ICD-10-CM | POA: Diagnosis not present

## 2016-12-01 DIAGNOSIS — M9904 Segmental and somatic dysfunction of sacral region: Secondary | ICD-10-CM | POA: Diagnosis not present

## 2017-03-07 DIAGNOSIS — M9904 Segmental and somatic dysfunction of sacral region: Secondary | ICD-10-CM | POA: Diagnosis not present

## 2017-03-07 DIAGNOSIS — M9903 Segmental and somatic dysfunction of lumbar region: Secondary | ICD-10-CM | POA: Diagnosis not present

## 2017-03-07 DIAGNOSIS — M9902 Segmental and somatic dysfunction of thoracic region: Secondary | ICD-10-CM | POA: Diagnosis not present

## 2017-03-07 DIAGNOSIS — M530 Cervicocranial syndrome: Secondary | ICD-10-CM | POA: Diagnosis not present

## 2017-03-07 DIAGNOSIS — M609 Myositis, unspecified: Secondary | ICD-10-CM | POA: Diagnosis not present

## 2017-03-07 DIAGNOSIS — M5136 Other intervertebral disc degeneration, lumbar region: Secondary | ICD-10-CM | POA: Diagnosis not present

## 2017-03-07 DIAGNOSIS — M9901 Segmental and somatic dysfunction of cervical region: Secondary | ICD-10-CM | POA: Diagnosis not present

## 2017-03-07 DIAGNOSIS — M542 Cervicalgia: Secondary | ICD-10-CM | POA: Diagnosis not present

## 2017-03-07 DIAGNOSIS — M5431 Sciatica, right side: Secondary | ICD-10-CM | POA: Diagnosis not present

## 2017-04-02 DIAGNOSIS — M609 Myositis, unspecified: Secondary | ICD-10-CM | POA: Diagnosis not present

## 2017-04-02 DIAGNOSIS — M530 Cervicocranial syndrome: Secondary | ICD-10-CM | POA: Diagnosis not present

## 2017-04-02 DIAGNOSIS — M5136 Other intervertebral disc degeneration, lumbar region: Secondary | ICD-10-CM | POA: Diagnosis not present

## 2017-04-02 DIAGNOSIS — M9901 Segmental and somatic dysfunction of cervical region: Secondary | ICD-10-CM | POA: Diagnosis not present

## 2017-04-02 DIAGNOSIS — M9902 Segmental and somatic dysfunction of thoracic region: Secondary | ICD-10-CM | POA: Diagnosis not present

## 2017-04-02 DIAGNOSIS — M9904 Segmental and somatic dysfunction of sacral region: Secondary | ICD-10-CM | POA: Diagnosis not present

## 2017-04-02 DIAGNOSIS — M542 Cervicalgia: Secondary | ICD-10-CM | POA: Diagnosis not present

## 2017-04-02 DIAGNOSIS — M9903 Segmental and somatic dysfunction of lumbar region: Secondary | ICD-10-CM | POA: Diagnosis not present

## 2017-04-02 DIAGNOSIS — M5431 Sciatica, right side: Secondary | ICD-10-CM | POA: Diagnosis not present

## 2017-05-03 ENCOUNTER — Other Ambulatory Visit: Payer: Self-pay | Admitting: Obstetrics and Gynecology

## 2017-05-03 DIAGNOSIS — Z1231 Encounter for screening mammogram for malignant neoplasm of breast: Secondary | ICD-10-CM

## 2017-05-16 DIAGNOSIS — M9904 Segmental and somatic dysfunction of sacral region: Secondary | ICD-10-CM | POA: Diagnosis not present

## 2017-05-16 DIAGNOSIS — M9902 Segmental and somatic dysfunction of thoracic region: Secondary | ICD-10-CM | POA: Diagnosis not present

## 2017-05-16 DIAGNOSIS — M9901 Segmental and somatic dysfunction of cervical region: Secondary | ICD-10-CM | POA: Diagnosis not present

## 2017-05-16 DIAGNOSIS — M5136 Other intervertebral disc degeneration, lumbar region: Secondary | ICD-10-CM | POA: Diagnosis not present

## 2017-05-16 DIAGNOSIS — M5431 Sciatica, right side: Secondary | ICD-10-CM | POA: Diagnosis not present

## 2017-05-16 DIAGNOSIS — M542 Cervicalgia: Secondary | ICD-10-CM | POA: Diagnosis not present

## 2017-05-16 DIAGNOSIS — M530 Cervicocranial syndrome: Secondary | ICD-10-CM | POA: Diagnosis not present

## 2017-05-16 DIAGNOSIS — M609 Myositis, unspecified: Secondary | ICD-10-CM | POA: Diagnosis not present

## 2017-05-16 DIAGNOSIS — M9903 Segmental and somatic dysfunction of lumbar region: Secondary | ICD-10-CM | POA: Diagnosis not present

## 2017-05-25 DIAGNOSIS — M9903 Segmental and somatic dysfunction of lumbar region: Secondary | ICD-10-CM | POA: Diagnosis not present

## 2017-05-25 DIAGNOSIS — M9902 Segmental and somatic dysfunction of thoracic region: Secondary | ICD-10-CM | POA: Diagnosis not present

## 2017-05-25 DIAGNOSIS — M5136 Other intervertebral disc degeneration, lumbar region: Secondary | ICD-10-CM | POA: Diagnosis not present

## 2017-05-25 DIAGNOSIS — M9904 Segmental and somatic dysfunction of sacral region: Secondary | ICD-10-CM | POA: Diagnosis not present

## 2017-05-25 DIAGNOSIS — M5431 Sciatica, right side: Secondary | ICD-10-CM | POA: Diagnosis not present

## 2017-05-25 DIAGNOSIS — M609 Myositis, unspecified: Secondary | ICD-10-CM | POA: Diagnosis not present

## 2017-05-25 DIAGNOSIS — M9901 Segmental and somatic dysfunction of cervical region: Secondary | ICD-10-CM | POA: Diagnosis not present

## 2017-05-25 DIAGNOSIS — M542 Cervicalgia: Secondary | ICD-10-CM | POA: Diagnosis not present

## 2017-05-25 DIAGNOSIS — M530 Cervicocranial syndrome: Secondary | ICD-10-CM | POA: Diagnosis not present

## 2017-05-29 ENCOUNTER — Ambulatory Visit
Admission: RE | Admit: 2017-05-29 | Discharge: 2017-05-29 | Disposition: A | Discharge status: Home / Self Care | Payer: BLUE CROSS/BLUE SHIELD | Source: Ambulatory Visit | Attending: Obstetrics and Gynecology | Admitting: Obstetrics and Gynecology

## 2017-05-29 DIAGNOSIS — Z1231 Encounter for screening mammogram for malignant neoplasm of breast: Secondary | ICD-10-CM | POA: Diagnosis not present

## 2017-06-13 DIAGNOSIS — M542 Cervicalgia: Secondary | ICD-10-CM | POA: Diagnosis not present

## 2017-06-13 DIAGNOSIS — M609 Myositis, unspecified: Secondary | ICD-10-CM | POA: Diagnosis not present

## 2017-06-13 DIAGNOSIS — M530 Cervicocranial syndrome: Secondary | ICD-10-CM | POA: Diagnosis not present

## 2017-06-13 DIAGNOSIS — M5431 Sciatica, right side: Secondary | ICD-10-CM | POA: Diagnosis not present

## 2017-06-13 DIAGNOSIS — M9904 Segmental and somatic dysfunction of sacral region: Secondary | ICD-10-CM | POA: Diagnosis not present

## 2017-06-13 DIAGNOSIS — M9901 Segmental and somatic dysfunction of cervical region: Secondary | ICD-10-CM | POA: Diagnosis not present

## 2017-06-13 DIAGNOSIS — M9903 Segmental and somatic dysfunction of lumbar region: Secondary | ICD-10-CM | POA: Diagnosis not present

## 2017-06-13 DIAGNOSIS — M5136 Other intervertebral disc degeneration, lumbar region: Secondary | ICD-10-CM | POA: Diagnosis not present

## 2017-06-13 DIAGNOSIS — M9902 Segmental and somatic dysfunction of thoracic region: Secondary | ICD-10-CM | POA: Diagnosis not present

## 2017-07-13 ENCOUNTER — Ambulatory Visit (INDEPENDENT_AMBULATORY_CARE_PROVIDER_SITE_OTHER): Payer: Self-pay | Admitting: Emergency Medicine

## 2017-07-13 VITALS — BP 130/70 | HR 82 | Temp 98.4°F | Wt 180.2 lb

## 2017-07-13 DIAGNOSIS — J0100 Acute maxillary sinusitis, unspecified: Secondary | ICD-10-CM

## 2017-07-13 MED ORDER — AMOXICILLIN-POT CLAVULANATE 875-125 MG PO TABS: 1.0000 | ORAL_TABLET | Freq: Two times a day (BID) | ORAL | 0 refills | Status: DC

## 2017-07-13 MED ORDER — PREDNISONE 20 MG PO TABS: 20.0000 mg | ORAL_TABLET | Freq: Every day | ORAL | 0 refills | Status: DC

## 2017-07-13 MED FILL — AMOX-CLAV 875-125 MG TABLET: 875-125 | 10 days supply | Qty: 20 | Fill #0

## 2017-07-13 MED FILL — predniSONE 20 MG TABS: 20 | 6 days supply | Qty: 6 | Fill #0

## 2017-07-13 NOTE — Progress Notes (Signed)
Subjective:     Tracy Moore is a 57 y.o. female who presents for evaluation of sinus pain. Symptoms include: congestion, cough, facial pain, foul rhinorrhea and tooth pain. Onset of symptoms was 9 days ago. Symptoms have been gradually worsening since that time. Past history is significant for no history of pneumonia or bronchitis. Patient is a non-smoker.  The following portions of the patient's history were reviewed and updated as appropriate: allergies and current medications.  Review of Systems Pertinent items noted in HPI and remainder of comprehensive ROS otherwise negative.   Objective:    BP 130/70   Pulse 82   Temp 98.4 F (36.9 C)   Wt 180 lb 3.2 oz (81.7 kg)   SpO2 97%   BMI 28.22 kg/m  General appearance: alert, cooperative and appears stated age Head: Normocephalic, without obvious abnormality, atraumatic Eyes: negative Ears: normal TM's and external ear canals both ears Nose: bilateral sinus tenderness to percussion Throat: lips, mucosa, and tongue normal; teeth and gums normal Neck: no carotid bruit Lungs: clear to auscultation bilaterally Heart: regular rate and rhythm Extremities: extremities normal, atraumatic, no cyanosis or edema Pulses: 2+ and symmetric    Assessment:    Acute bacterial sinusitis.    Plan:    Nasal saline sprays. Neti pot recommended. Instructions given. Nasal steroids per medication orders. Augmentin per medication orders. Follow up in 7 days or as needed.

## 2017-07-13 NOTE — Patient Instructions (Signed)

## 2017-07-15 ENCOUNTER — Telehealth: Payer: Self-pay

## 2017-07-15 NOTE — Telephone Encounter (Signed)
Courtesy call - Unable to leave message - Per recording vm has not been set up yet. Please try your call again.

## 2017-07-18 ENCOUNTER — Encounter: Payer: Self-pay | Admitting: *Deleted

## 2017-07-18 DIAGNOSIS — F32A Depression, unspecified: Secondary | ICD-10-CM | POA: Insufficient documentation

## 2017-07-18 DIAGNOSIS — F329 Major depressive disorder, single episode, unspecified: Secondary | ICD-10-CM | POA: Insufficient documentation

## 2017-07-19 ENCOUNTER — Encounter: Payer: Self-pay | Admitting: Family Medicine

## 2017-07-19 ENCOUNTER — Ambulatory Visit (INDEPENDENT_AMBULATORY_CARE_PROVIDER_SITE_OTHER): Payer: No Typology Code available for payment source | Admitting: Family Medicine

## 2017-07-19 VITALS — BP 109/74 | HR 71 | Temp 97.7°F | Resp 16 | Ht 66.0 in | Wt 175.0 lb

## 2017-07-19 DIAGNOSIS — Z1283 Encounter for screening for malignant neoplasm of skin: Secondary | ICD-10-CM | POA: Diagnosis not present

## 2017-07-19 DIAGNOSIS — Z0100 Encounter for examination of eyes and vision without abnormal findings: Secondary | ICD-10-CM

## 2017-07-19 DIAGNOSIS — M5431 Sciatica, right side: Secondary | ICD-10-CM | POA: Diagnosis not present

## 2017-07-19 DIAGNOSIS — Z0000 Encounter for general adult medical examination without abnormal findings: Secondary | ICD-10-CM

## 2017-07-19 LAB — CBC WITH DIFFERENTIAL/PLATELET
BASOS ABS: 0.1 10*3/uL (ref 0.0–0.1)
BASOS PCT: 0.8 % (ref 0.0–3.0)
EOS ABS: 0.1 10*3/uL (ref 0.0–0.7)
Eosinophils Relative: 1.6 % (ref 0.0–5.0)
HEMATOCRIT: 40.7 % (ref 36.0–46.0)
Hemoglobin: 13.3 g/dL (ref 12.0–15.0)
LYMPHS ABS: 2.7 10*3/uL (ref 0.7–4.0)
LYMPHS PCT: 38.8 % (ref 12.0–46.0)
MCHC: 32.7 g/dL (ref 30.0–36.0)
MCV: 92.7 fl (ref 78.0–100.0)
MONOS PCT: 7.9 % (ref 3.0–12.0)
Monocytes Absolute: 0.6 10*3/uL (ref 0.1–1.0)
NEUTROS ABS: 3.6 10*3/uL (ref 1.4–7.7)
NEUTROS PCT: 50.9 % (ref 43.0–77.0)
PLATELETS: 409 10*3/uL — AB (ref 150.0–400.0)
RBC: 4.39 Mil/uL (ref 3.87–5.11)
RDW: 13.9 % (ref 11.5–15.5)
WBC: 7 10*3/uL (ref 4.0–10.5)

## 2017-07-19 LAB — COMPREHENSIVE METABOLIC PANEL
ALT: 29 U/L (ref 0–35)
AST: 17 U/L (ref 0–37)
Albumin: 4.3 g/dL (ref 3.5–5.2)
Alkaline Phosphatase: 64 U/L (ref 39–117)
BUN: 19 mg/dL (ref 6–23)
CALCIUM: 9.2 mg/dL (ref 8.4–10.5)
CHLORIDE: 105 meq/L (ref 96–112)
CO2: 27 meq/L (ref 19–32)
Creatinine, Ser: 0.84 mg/dL (ref 0.40–1.20)
GFR: 74.86 mL/min (ref >60.00)
GLUCOSE: 93 mg/dL (ref 70–99)
Potassium: 4 mEq/L (ref 3.5–5.1)
Sodium: 141 mEq/L (ref 135–145)
Total Bilirubin: 0.5 mg/dL (ref 0.2–1.2)
Total Protein: 7.3 g/dL (ref 6.0–8.3)

## 2017-07-19 LAB — LIPID PANEL
CHOL/HDL RATIO: 4
Cholesterol: 216 mg/dL — ABNORMAL HIGH (ref 0–200)
HDL: 48.2 mg/dL (ref >39.00)
LDL Cholesterol: 142 mg/dL — ABNORMAL HIGH (ref 0–99)
NONHDL: 167.38
TRIGLYCERIDES: 128 mg/dL (ref 0.0–149.0)
VLDL: 25.6 mg/dL (ref 0.0–40.0)

## 2017-07-19 LAB — TSH: TSH: 0.89 u[IU]/mL (ref 0.35–4.50)

## 2017-07-19 NOTE — Progress Notes (Signed)
Office Note 07/19/2017  CC:  Chief Complaint  Patient presents with  . Annual Exam    HPI:  Tracy Moore is a 57 y.o. white female who is here for annual health maintenance exam. Pt has GYN MD.  Eyes: annual exams. Dental: utd Exercise: elliptical 3 d/week. Diet: healthy.  She has [employer] plan and will be getting ongoing care with specialists for annual vision exams (refer to Dr. Marygrace Moore today), dermatology (skin ca screening, hx of melanoma, refer pt to Dr. Griselda Moore today), and chiropractor visits for treatment of her R sciatic pain (refer to Tracy Moore in [facility] today).   Past Medical History:  Diagnosis Date  . Allergy    seasonal  . Depression   . Hay fever    spring/fall  . Melanoma ([place]) 08/2006   Righ thigh (Dr. Nevada Moore)  . Thyroid nodule 12/1998   Bx benign.Dr. Dwyane Moore following q1-2 yrs.  Pt has been euthyroid (was on synthroid at one point in time but this was d/c'd per pt).  F/u with endo 07/2016 showed stability so no further w/u recommended, just f/u with endo in 2 yrs.  . Varicose veins     Past Surgical History:  Procedure Laterality Date  . ANAL FISSURE REPAIR    . [place] and 2003  . COLONOSCOPY W/ POLYPECTOMY  08/07/2014   Sessile serrated polyp x 1.  Recall 5 yrs  . KNEE ARTHROSCOPY Left 1992  . thyroid nodule biopsy  2000   Benign    Family History  Problem Relation Age of Onset  . Arthritis Mother   . Hypertension Father   . Arthritis Father   . Heart disease Father   . Thyroid disease Father   . Autism Son   . Dementia Maternal Grandmother   . Cancer Maternal Grandfather        prostate  . Cancer Paternal Grandfather        pancreatic cancer  . Colon cancer Paternal Grandfather 4    Social History   Socioeconomic History  . Marital status: Married    Spouse name: Not on file  . Number of children: Not on file  . Years of education: Not on file  . Highest education level: Not on  file  Social Needs  . Financial resource strain: Not on file  . Food insecurity - worry: Not on file  . Food insecurity - inability: Not on file  . Transportation needs - medical: Not on file  . Transportation needs - non-medical: Not on file  Occupational History  . Not on file  Tobacco Use  . Smoking status: Never Smoker  . Smokeless tobacco: Never Used  Substance and Sexual Activity  . Alcohol use: Yes    Alcohol/week: 0.0 oz    Comment: socially  . Drug use: No  . Sexual activity: Not on file  Other Topics Concern  . Not on file  Social History Narrative   Married, daughter 70 y/o , son 45 y/o (has autism).   Lives in [place].   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  No drugs.    Outpatient Medications Prior to Visit  Medication Sig Dispense Refill  . 5-Hydroxytryptophan (5-HTP) 100 MG CAPS Take by mouth.    Marland Kitchen acetaminophen (TYLENOL) 325 MG tablet Take 650 mg by mouth every 6 (six) hours as needed.    Marland Kitchen amoxicillin-clavulanate (AUGMENTIN) 875-125 MG tablet Take 1  tablet by mouth 2 (two) times daily. 20 tablet 0  . Black Pepper-Turmeric (TURMERIC CURCUMIN) 11-998 MG CAPS Take by mouth.    . Calcium Carb-Cholecalciferol (CALCIUM PLUS VITAMIN D3) 600-500 MG-UNIT CAPS Take 2 tablets by mouth daily. Reported on 08/25/2015    . ibuprofen (ADVIL,MOTRIN) 200 MG tablet Take 200 mg by mouth every 6 (six) hours as needed.    Marland Kitchen levonorgestrel (MIRENA) 20 MCG/24HR IUD 1 each by Intrauterine route once.    . Misc Natural Products (GRAPE SEED COMPLEX PO) Take by mouth.    . SM SUPER B COMPLEX/C PO Take 1 tablet by mouth daily.    Marland Kitchen BIOTIN PO Take 1,000 mg by mouth daily.    . predniSONE (DELTASONE) 20 MG tablet Take 1 tablet (20 mg total) by mouth daily with breakfast. 6 tablet 0   No facility-administered medications prior to visit.     No Known Allergies  ROS Review of Systems  Constitutional: Negative for appetite change, chills, fatigue and fever.   HENT: Negative for congestion, dental problem, ear pain and sore throat.   Eyes: Negative for discharge, redness and visual disturbance.  Respiratory: Negative for cough, chest tightness, shortness of breath and wheezing.   Cardiovascular: Negative for chest pain, palpitations and leg swelling.  Gastrointestinal: Negative for abdominal pain, blood in stool, diarrhea, nausea and vomiting.  Genitourinary: Negative for difficulty urinating, dysuria, flank pain, frequency, hematuria and urgency.  Musculoskeletal: Negative for arthralgias, back pain, joint swelling, myalgias and neck stiffness.  Skin: Negative for pallor and rash.  Neurological: Negative for dizziness, speech difficulty, weakness and headaches.  Hematological: Negative for adenopathy. Does not bruise/bleed easily.  Psychiatric/Behavioral: Negative for confusion and sleep disturbance. The patient is not nervous/anxious.     PE; Blood pressure 109/74, pulse 71, temperature 97.7 F (36.5 C), temperature source Oral, resp. rate 16, height 5\' 6"  (1.676 m), weight 175 lb (79.4 kg), SpO2 96 %.  Exam chaperoned by Tracy Moore, CMA.  Gen: Alert, well appearing.  Patient is oriented to person, place, time, and situation. AFFECT: pleasant, lucid thought and speech. ENT: Ears: EACs clear, normal epithelium.  TMs with good light reflex and landmarks bilaterally.  Eyes: no injection, icteris, swelling, or exudate.  EOMI, PERRLA. Nose: no drainage or turbinate edema/swelling.  No injection or focal lesion.  Mouth: lips without lesion/swelling.  Oral mucosa pink and moist.  Dentition intact and without obvious caries or gingival swelling.  Oropharynx without erythema, exudate, or swelling.  Neck: supple/nontender.  No LAD, mass, or TM.  Carotid pulses 2+ bilaterally, without bruits. CV: RRR, no m/r/g.   LUNGS: CTA bilat, nonlabored resps, good aeration in all lung fields. ABD: soft, NT, ND, BS normal.  No hepatospenomegaly or mass.  No  bruits. EXT: no clubbing, cyanosis, or edema.  Musculoskeletal: no joint swelling, erythema, warmth, or tenderness.  ROM of all joints intact. Skin - no sores or suspicious lesions or rashes or color changes   Pertinent labs:  Lab Results  Component Value Date   TSH 0.67 05/24/2016   Lab Results  Component Value Date   WBC 4.8 05/24/2016   HGB 13.5 05/24/2016   HCT 40.3 05/24/2016   MCV 90.7 05/24/2016   PLT 324.0 05/24/2016   Lab Results  Component Value Date   CREATININE 0.80 05/24/2016   BUN 13 05/24/2016   NA 142 05/24/2016   K 4.4 05/24/2016   CL 107 05/24/2016   CO2 28 05/24/2016   Lab Results  Component Value  Date   ALT 21 05/24/2016   AST 17 05/24/2016   ALKPHOS 64 05/24/2016   BILITOT 0.5 05/24/2016   Lab Results  Component Value Date   CHOL 222 (H) 05/24/2016   Lab Results  Component Value Date   HDL 47.20 05/24/2016   Lab Results  Component Value Date   LDLCALC 145 (H) 05/24/2016   Lab Results  Component Value Date   TRIG 150.0 (H) 05/24/2016   Lab Results  Component Value Date   CHOLHDL 5 05/24/2016    ASSESSMENT AND PLAN:   Health maintenance exam: Reviewed age and gender appropriate health maintenance issues (prudent diet, regular exercise, health risks of tobacco and excessive alcohol, use of seatbelts, fire alarms in home, use of sunscreen).  Also reviewed age and gender appropriate health screening as well as vaccine recommendations. Vaccines: Tdap UTD.  Flu vaccine-- done 03/2017.   Shingrix discussed today--pt declined for now. Labs: fasting HP labs drawn today. Cervical ca screening: via GYN--we'll get their office to send up record of most recent pap. Breast ca screening: normal mammo 05/2017.  Repeat 1 yr (via GYN). Colon ca screening: next colonoscopy due 2021.  Appropriate referrals ordered today for [place], ophthalmologist, and dermatology (see HPI for details).  An After Visit Summary was printed and given to  the patient.  FOLLOW UP:  Return in about 1 year (around 07/19/2018) for annual CPE (fasting).  Signed:  Crissie Sickles, MD           07/19/2017

## 2017-07-19 NOTE — Patient Instructions (Signed)

## 2017-07-20 ENCOUNTER — Encounter: Payer: Self-pay | Admitting: *Deleted

## 2017-09-27 DIAGNOSIS — Z85828 Personal history of other malignant neoplasm of skin: Secondary | ICD-10-CM | POA: Insufficient documentation

## 2017-09-27 DIAGNOSIS — D239 Other benign neoplasm of skin, unspecified: Secondary | ICD-10-CM | POA: Insufficient documentation

## 2017-10-04 ENCOUNTER — Encounter: Payer: Self-pay | Admitting: Family Medicine

## 2017-10-21 IMAGING — MG 2D DIGITAL SCREENING BILATERAL MAMMOGRAM WITH CAD AND ADJUNCT TO
8 of 12 series · 8 of 28 positions shown · non-contrast
Comparison: Previous exam(s).

CLINICAL DATA: Screening.

EXAM:
2D DIGITAL SCREENING BILATERAL MAMMOGRAM WITH CAD AND ADJUNCT TOMO

[R MLO]
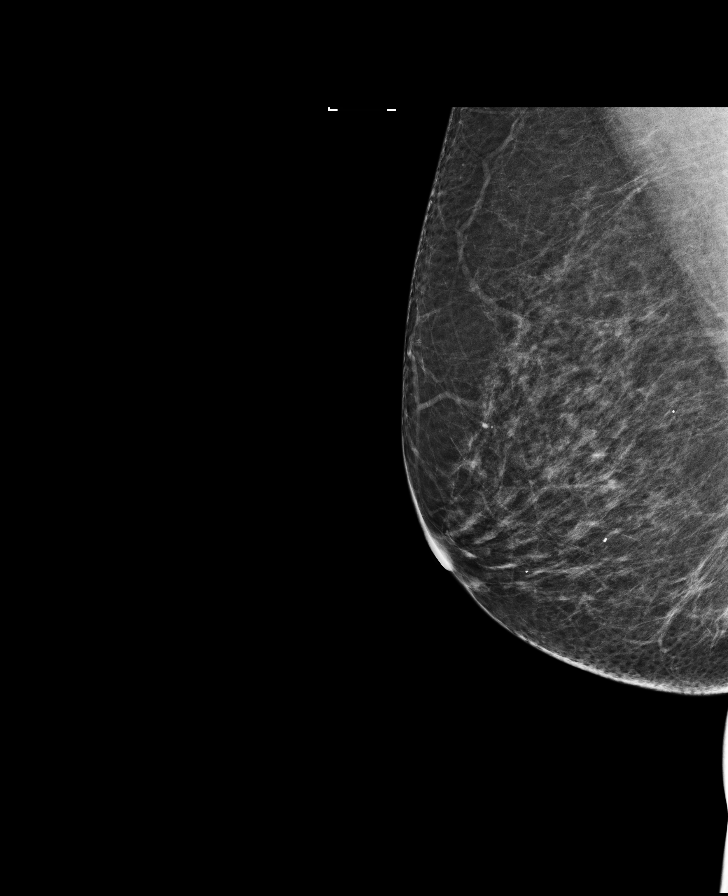

[L MLO synth-2D]
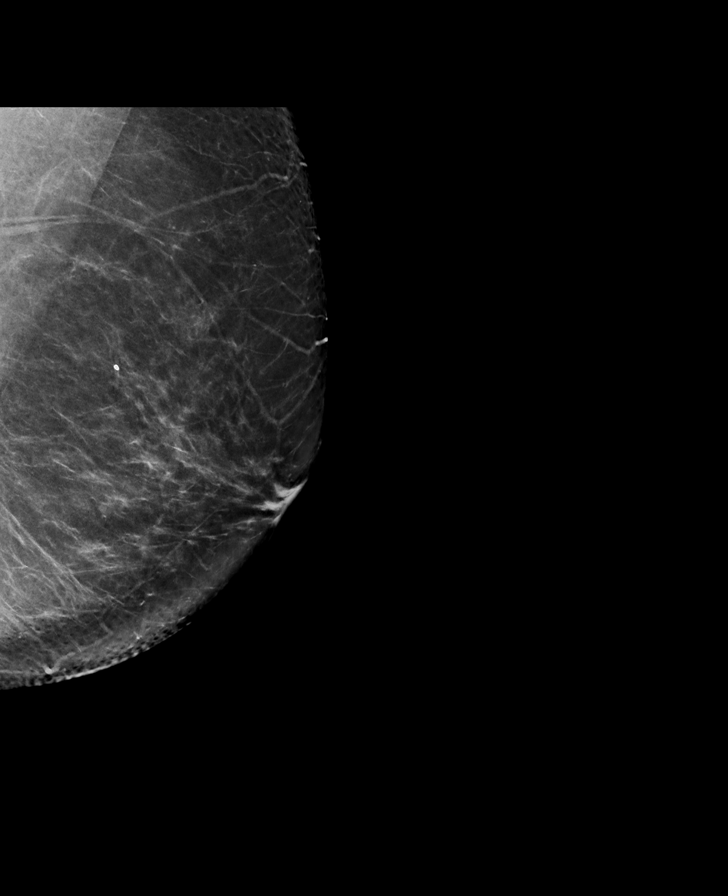

[R CC]
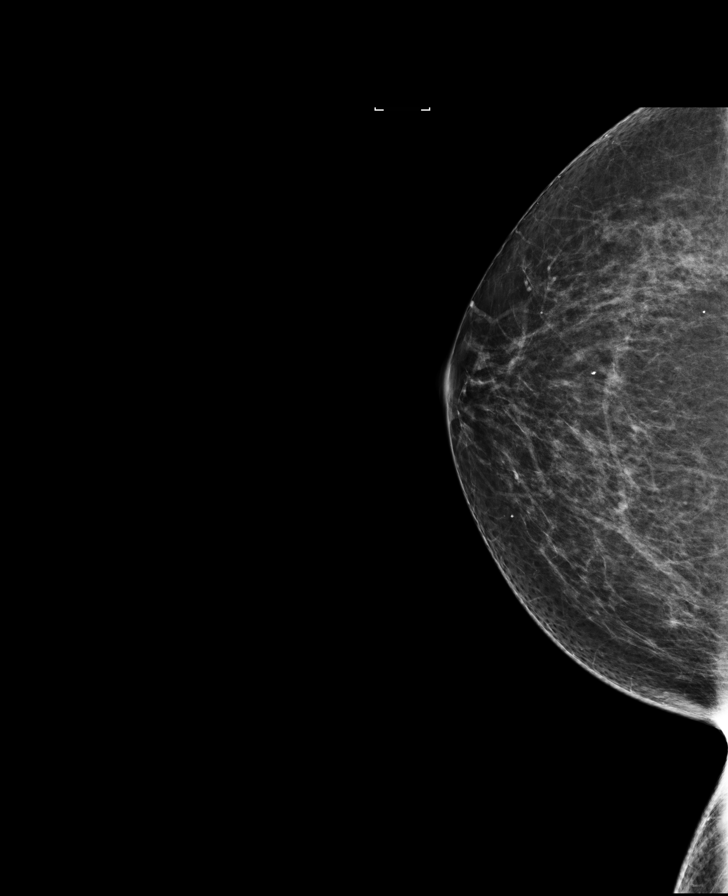

[L CC]
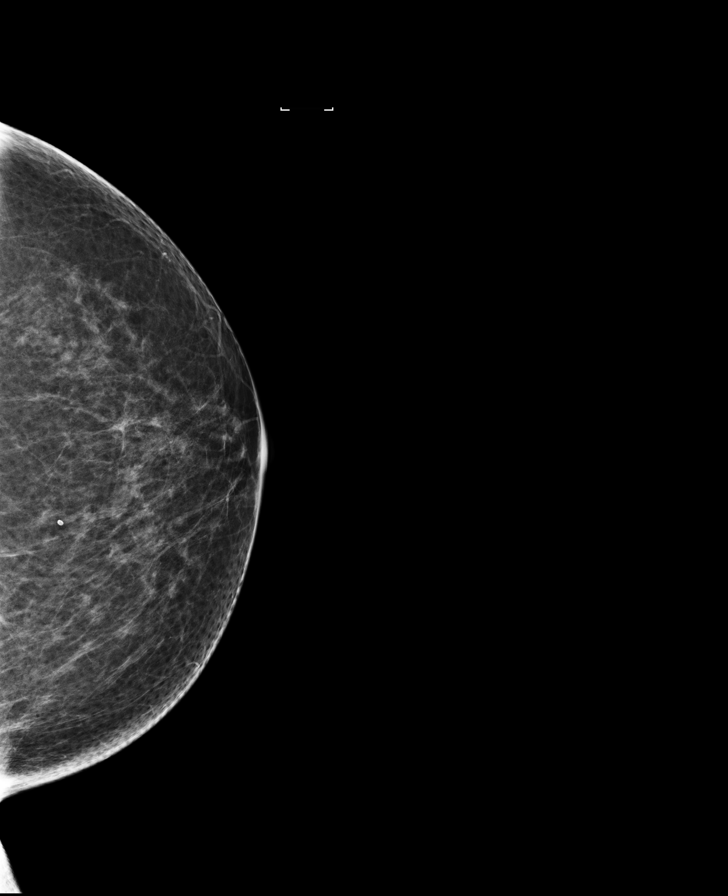

[R CC synth-2D]
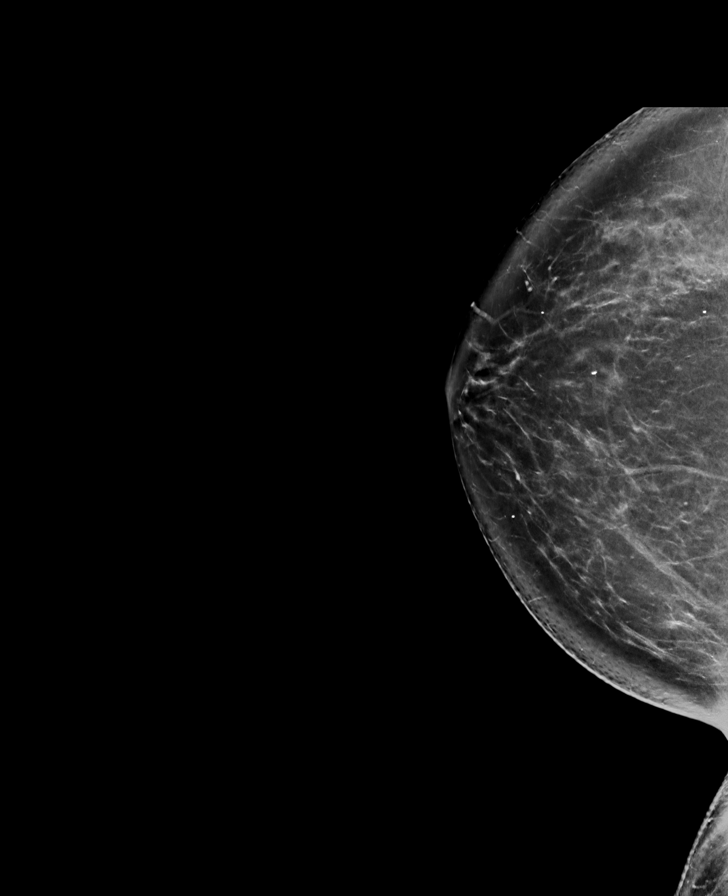

[L CC synth-2D]
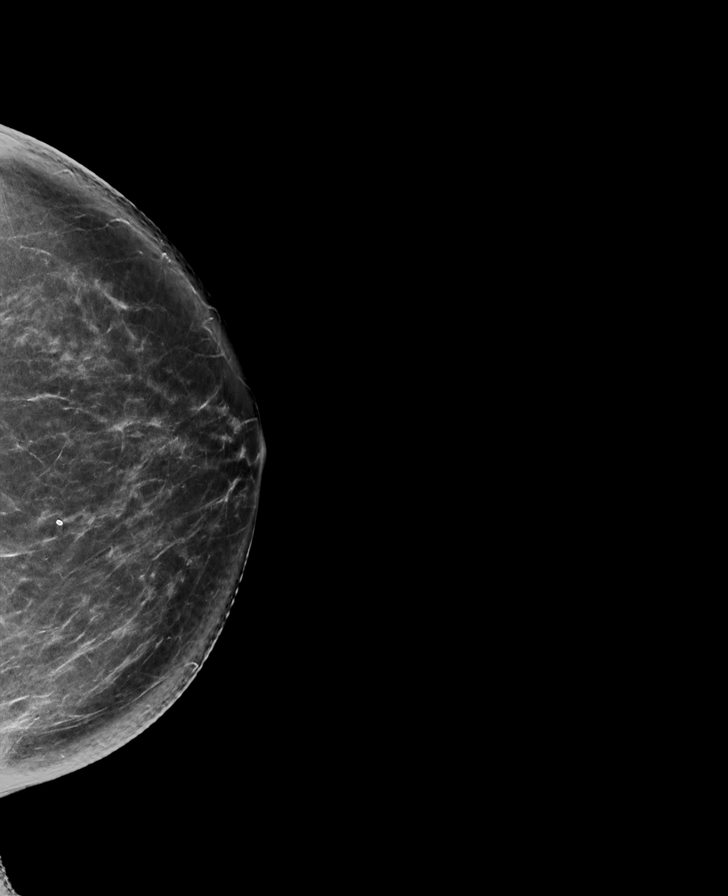

[R MLO synth-2D]
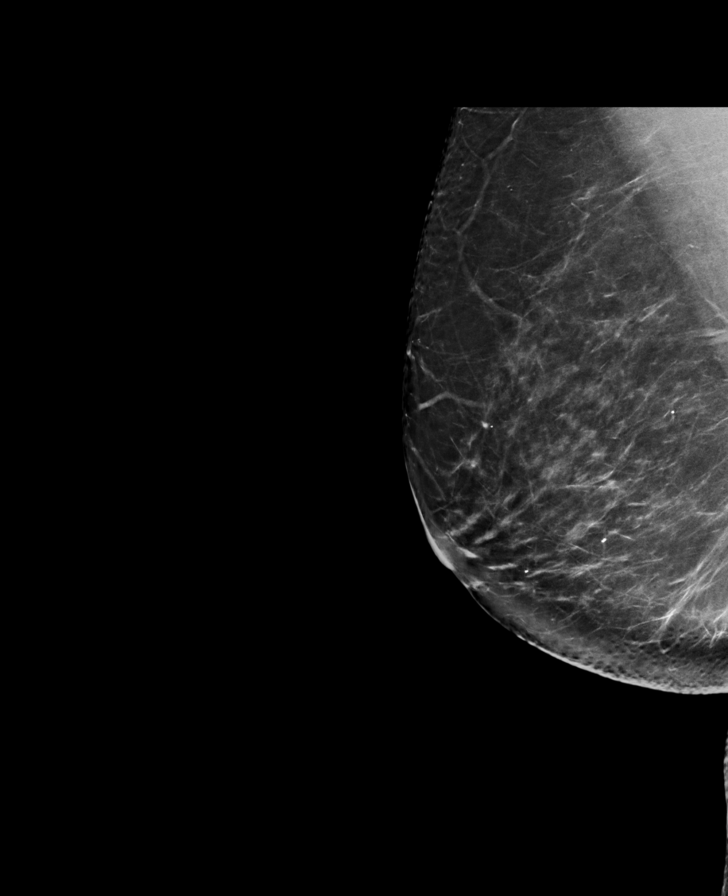

[L MLO]
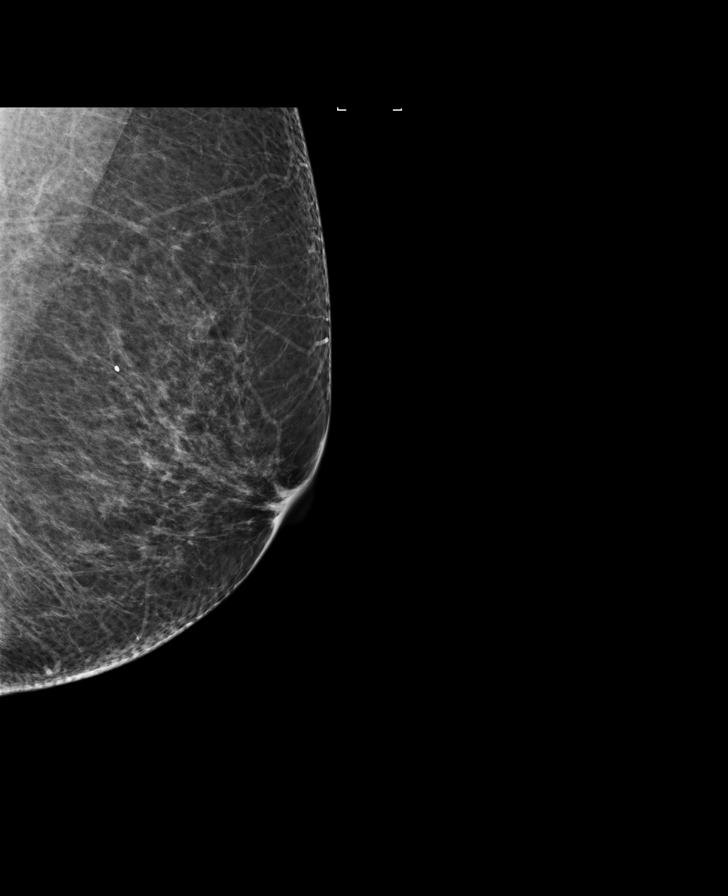

[8 of 28 positions shown; findings below may reference images not displayed]

ACR Breast Density Category b: There are scattered areas of
fibroglandular density.
FINDINGS: There are no findings suspicious for malignancy. Images were
processed with CAD.
IMPRESSION: No mammographic evidence of malignancy. A result letter of this
screening mammogram will be mailed directly to the patient.

RECOMMENDATION:
Screening mammogram in one year. (Code:97-6-RS4)

BI-RADS CATEGORY  1: Negative.

## 2017-11-01 MED FILL — MUPIROCIN 2% OINTMENT: 2 | 10 days supply | Qty: 22 | Fill #0

## 2017-11-02 ENCOUNTER — Telehealth: Payer: Self-pay | Admitting: *Deleted

## 2017-11-02 DIAGNOSIS — E663 Overweight: Secondary | ICD-10-CM

## 2017-11-02 NOTE — Telephone Encounter (Signed)
Copied from [place]. Topic: Referral - Request >> Nov 02, 2017  8:19 AM Synthia Innocent wrote: Reason for CRM: Requesting referral to dietician. Already scheduled for 11/08/17 with [facility]. Will need insurance referral, [employer]

## 2017-11-02 NOTE — Telephone Encounter (Signed)
OK, done

## 2017-11-02 NOTE — Telephone Encounter (Signed)
Please advise. Thanks.  

## 2017-11-08 ENCOUNTER — Encounter: Payer: No Typology Code available for payment source | Attending: Family Medicine

## 2017-11-08 DIAGNOSIS — Z713 Dietary counseling and surveillance: Secondary | ICD-10-CM | POA: Insufficient documentation

## 2017-11-08 DIAGNOSIS — Z0000 Encounter for general adult medical examination without abnormal findings: Secondary | ICD-10-CM

## 2017-11-08 NOTE — Patient Instructions (Addendum)
-   Honor your body cues, eat when hungry stop when satisfied  - Reducing distractions at meal times - Slow down at meal times - Make balanced plates at meal times: carbohydrate AND protein AND vegetable - Snacks can include a whole piece of fruit  - Great work with exercise, keep it up and change it up

## 2017-11-08 NOTE — Progress Notes (Signed)
Medical Nutrition Therapy:  Appt start time: 0800 end time:  0845.  Assessment:  Primary concerns today: Portion sizes, scared of carbohydrates, and ideas for going out to eat. Pt comes in for [employer]. First of three visits.  Pt just started Isagenix which entails 2 replacement shakes, 2 snacks and 1 meal (meat & vegetables). Pt reports she feels satisfied and has "lots of energy". Pt has not had success with many diets in the past, including weight watchers.   Preferred Learning Style:  No preference indicated   Learning Readiness:  Contemplating  MEDICATIONS: Review   DIETARY INTAKE:  Usual eating pattern includes 1 meal, 2 shakes and 2 snacks per day. Except for "cleanse days". Yesterday was a "cleanse day". Drink (cleanse) and snacks.   Everyday foods include broccoli, salads.  Avoided foods include none reported.    24-hr recall of typical day:  B ( AM): Isagenix shake  Snk ( AM): 1/2 apple or 1/2 granola bar  L ( PM): Isagenix shake  Snk ( PM): Isagenix wafers or Isagenix chocolate D ( PM): protein (fish, shrimp, pork loin, chicken), salad (peppers, mushrooms, tomatoes, broccoli/cauliflower)  Snk ( PM): orange   Beverages: shakes, water 4-5 24 oz bottles, coffee with couple tablespoons of 2% milk (has decreased from 4 cups to 2 cups)   Usual physical activity: walking 30 minutes/day (at least 5 days/week)  Progress Towards Goal(s):  In progress.   Nutritional Diagnosis:  NB-1.1 Food and nutrition-related knowledge deficit As related to limited education.  As evidenced by pt report .    Intervention:  Nutrition education provided. Encouraged patient to listen to her body's hunger and fullness cues. Promoted continued exercise. Advised patient to incorporate different foods and carbohydrates into meal times. Discussed slowing down and reducing distractions at meals times. Educated pt that the human liver "cleanses" the body and "fasting" multiple days can be  dangerous.   - Honor your body cues, eat when hungry stop when satisfied  - Reducing distractions and slow down at meal times - Make balanced plates at meal times: carbohydrate AND protein AND vegetable - Snacks can include a whole piece of fruit  - Great work with exercise, keep it up/change it up  Teaching Method Utilized: Ship broker  Handouts given during visit include:  MyPlate handout  Barriers to learning/adherence to lifestyle change: concerned with weight  Demonstrated degree of understanding via:  Teach Back   Monitoring/Evaluation:  Dietary intake, exercise, and body weight prn.

## 2017-11-13 ENCOUNTER — Encounter: Payer: Self-pay | Admitting: Family Medicine

## 2017-11-22 ENCOUNTER — Ambulatory Visit: Payer: No Typology Code available for payment source

## 2017-11-22 ENCOUNTER — Encounter: Payer: No Typology Code available for payment source | Admitting: Registered"

## 2017-11-22 DIAGNOSIS — Z713 Dietary counseling and surveillance: Secondary | ICD-10-CM

## 2017-11-22 NOTE — Progress Notes (Signed)
Medical Nutrition Therapy:  Appt start time: 0930 end time:  1000.  [employer] [employer] visit 2 of 3  Assessment:  Primary concerns today: Patient states she continues with the Isagenix nearing her first 30 days and plans to do another 30 days. Pt states after that she plans to go down to 1 shake per day and start having more regular meals again.   Pt states she has a lot more energy and feels like she has lost weight, clothes and jewlry are looser and plans to weigh herself next week after she has been doing this plan for a month.  Patient states she wakes up at 3:45 am 1 cup coffee, after work 3-4 pm another cup.  Patient asked about nuts and states she was eating them before starting the Isagenix program.  Pt states she does 1 cleanse day every 5 days and every hour drinking something and will have a little snack here and there.  For next visit pt states she would like recipes/ideas for sweet desserts without a lot of sugar and snack ideas.  Preferred Learning Style:  No preference indicated   Learning Readiness:  Contemplating  MEDICATIONS: Review   DIETARY INTAKE:  Usual eating pattern includes 1 meal, 2 shakes and 2 snacks per day. Every 5 days does a  "cleanse day". Drink every hour (cleanse) and snacks.   Everyday foods include broccoli, salads.   24-hr recall of typical day:  B ( AM): Isagenix shake, isodrink with water  Snk ( AM): Isadelight wafers L ( PM): Isagenix shake  Snk ( PM): Isagenix BBQ flavor wheat thins D ( PM): flounder, veggie steamers, slaw, orange Snk ( PM): none Beverages: shakes, water, 2 c coffee with couple tablespoons of 2% milk  Usual physical activity: walking 45 minutes/day, some hand weights  Progress Towards Goal(s):  In progress.   Nutritional Diagnosis:  NB-1.1 Food and nutrition-related knowledge deficit As related to limited education.  As evidenced by pt report .    Intervention:  Nutrition education provided. Encouraged patient to  continue listening to her body's hunger and fullness cues. Discussed role of each food group on MyPlate and balanced eating.   Plan: Continue to stay active and incorporating resistance training. As you are transitioning from shakes to meals, remember to include Protein, vegetables and carbohydrates to get complete nutrition. Continue to think about eating mindfully without distractions  Teaching Method Utilized: Visual Auditory  Handouts given during visit include:  none  Barriers to learning/adherence to lifestyle change: concerned with weight  Demonstrated degree of understanding via:  Teach Back   Monitoring/Evaluation:  Dietary intake, exercise, and body weight in 4 week(s).

## 2017-11-22 NOTE — Patient Instructions (Signed)
Continue to stay active and incorporating resistance training. As you are transitioning from shakes to meals, remember to include Protein, vegetables and carbohydrates to get complete nutrition. Continue to think about eating mindfully without distractions

## 2017-11-26 DIAGNOSIS — Z713 Dietary counseling and surveillance: Secondary | ICD-10-CM | POA: Insufficient documentation

## 2017-12-20 ENCOUNTER — Encounter: Payer: No Typology Code available for payment source | Attending: Family Medicine | Admitting: Registered"

## 2017-12-20 DIAGNOSIS — Z713 Dietary counseling and surveillance: Secondary | ICD-10-CM | POA: Insufficient documentation

## 2017-12-20 NOTE — Patient Instructions (Signed)
Continue eating balanced meals. Can use smoothie ideas for desserts Consider eating mindfully Continue with regular exercise

## 2017-12-20 NOTE — Progress Notes (Signed)
Medical Nutrition Therapy:  Appt start time: 1430 end time:  4034.  [employer] visit 3 of 3  Assessment:  Primary concerns today: Patient states she continues with the Isagenix. Last visit patient stated she plans to go down to 1 shake per day and wanted ideas for breakfast.   Pt states she still has a lot of energy, today she is just getting off work and has plenty of energy and will probably go walking. Pt states she has discovered that if she exercises too late it affects her ability to go to sleep.  Preferred Learning Style:  No preference indicated   Learning Readiness:  Contemplating  MEDICATIONS: Review   DIETARY INTAKE:  Usual eating pattern includes 1 meal, 2 shakes and 2 snacks per day. Every 5 days does a  "cleanse day". Drink every hour (cleanse) and snacks.   Everyday foods include broccoli, salads.   24-hr recall of typical day:  B ( AM): Isagenix shake  Snk ( AM): apple L ( PM): Isagenix shake  Snk ( PM): guacamole carrots, green peppers D ( PM): steak, caesar salad 1/2 plate, orange Snk ( PM): tortilla chips Beverages: shakes, water, 2 c coffee with 2% milk  Usual physical activity: walking 45 minutes/day, some hand weights  Progress Towards Goal(s):  In progress.   Nutritional Diagnosis:  NB-1.1 Food and nutrition-related knowledge deficit As related to limited education.  As evidenced by pt report .    Intervention:  Nutrition education provided. Encouraged patient to continue listening to her body's hunger and fullness cues. Discussed balanced eating.   Plan: (progress from last visit)  Continue to stay active and incorporating resistance training. (continues to have progress: Walking or using elliptical daily, hand weights every other day.)  As you are transitioning from shakes to meals, remember to include Protein, vegetables and carbohydrates to get complete nutrition. (asked for ideas when eating regular food instead of shakes for  breakfast)  Continue to think about eating mindfully without distractions. (patient states she is putting down fork in between bites, watching husband eat fast motivates her to slow down. has fruits sometimes for something sweet.)  Teaching Method Utilized: Visual Auditory  Handouts given during visit include:  Smoothie recipes  Sleep Hygiene & Sleep survey  Barriers to learning/adherence to lifestyle change: concerned with weight  Demonstrated degree of understanding via:  Teach Back   Monitoring/Evaluation:  Dietary intake, exercise, and body weight prn.

## 2018-04-25 ENCOUNTER — Other Ambulatory Visit: Payer: Self-pay | Admitting: Obstetrics and Gynecology

## 2018-04-25 DIAGNOSIS — Z1231 Encounter for screening mammogram for malignant neoplasm of breast: Secondary | ICD-10-CM

## 2018-05-23 ENCOUNTER — Encounter: Payer: Self-pay | Admitting: Family Medicine

## 2018-05-23 ENCOUNTER — Ambulatory Visit (INDEPENDENT_AMBULATORY_CARE_PROVIDER_SITE_OTHER): Payer: No Typology Code available for payment source | Admitting: Family Medicine

## 2018-05-23 VITALS — BP 101/64 | HR 67 | Temp 98.2°F | Resp 16 | Ht 66.0 in | Wt 166.1 lb

## 2018-05-23 DIAGNOSIS — M7741 Metatarsalgia, right foot: Secondary | ICD-10-CM | POA: Diagnosis not present

## 2018-05-23 NOTE — Progress Notes (Signed)
OFFICE VISIT  05/23/2018   CC:  Chief Complaint  Patient presents with  . Foot Pain    right mostly   HPI:    Patient is a 57 y.o.  female who presents for discussion of feet problem. Onset about 2 wks ago, pain in bottom of right foot over metatarsal head regions.  Occ shooting of the metatarsal pain into the toes intermittently.  No heel or arch pain. Ice + massage slightly helpful.  Tried shoe inserts for plantar fasciitis--? Helped some.   Has been walking about 2 miles per day prior to onset, which was pretty new exercise for her. No traum/injury prior to onset of sx's.  Took ibupr 3 tabs occasionally but none in a week or so.  Past Medical History:  Diagnosis Date  . Allergic rhinitis    seasonal  . Depression   . Melanoma ([place]) 08/2006   Righ thigh (Dr. Nevada Crane); in situ--shave excision done, but [facility] did definitive excision: path showed residual melanoma in situ, margins free (11/01/17)  . Thyroid nodule 12/1998   Bx benign.Dr. Dwyane Dee following q1-2 yrs.  Pt has been euthyroid (was on synthroid at one point in time but this was d/c'd per pt).  F/u with endo 07/2016 showed stability so no further w/u recommended, just f/u with endo in 2 yrs.  . Varicose veins     Past Surgical History:  Procedure Laterality Date  . ANAL FISSURE REPAIR    . [place] and 2003  . COLONOSCOPY W/ POLYPECTOMY  08/07/2014   Sessile serrated polyp x 1.  Recall 5 yrs  . KNEE ARTHROSCOPY Left 1992  . thyroid nodule biopsy  2000   Benign    Outpatient Medications Prior to Visit  Medication Sig Dispense Refill  . 5-Hydroxytryptophan (5-HTP) 100 MG CAPS Take by mouth.    Marland Kitchen acetaminophen (TYLENOL) 325 MG tablet Take 650 mg by mouth every 6 (six) hours as needed.    Marland Kitchen ibuprofen (ADVIL,MOTRIN) 200 MG tablet Take 200 mg by mouth every 6 (six) hours as needed.    Marland Kitchen levonorgestrel (MIRENA) 20 MCG/24HR IUD 1 each by Intrauterine route once.    . Misc Natural Products (GRAPE SEED  COMPLEX PO) Take by mouth.    . SM SUPER B COMPLEX/C PO Take 1 tablet by mouth daily.    Marland Kitchen amoxicillin-clavulanate (AUGMENTIN) 875-125 MG tablet Take 1 tablet by mouth 2 (two) times daily. (Patient not taking: Reported on 11/08/2017) 20 tablet 0  . BIOTIN PO Take 1,000 mg by mouth daily.    Renard Hamper Pepper-Turmeric (TURMERIC CURCUMIN) 11-998 MG CAPS Take by mouth.    . Calcium Carb-Cholecalciferol (CALCIUM PLUS VITAMIN D3) 600-500 MG-UNIT CAPS Take 2 tablets by mouth daily. Reported on 08/25/2015     No facility-administered medications prior to visit.     No Known Allergies  ROS As per HPI  PE: Blood pressure 101/64, pulse 67, temperature 98.2 F (36.8 C), temperature source Oral, resp. rate 16, height 5\' 6"  (1.676 m), weight 166 lb 2 oz (75.4 kg), SpO2 96 %. Gen: Alert, well appearing.  Patient is oriented to person, place, time, and situation. AFFECT: pleasant, lucid thought and speech. Right foot: normal appearance.  DP and PT pulses normal. Pink, no rash.  Toes w/out contracture, arches normal. No tenderness anywhere on her foot except over the metatarsal head region of R foot, particularly the middle 3 metatarsal heads.  No focal severe pain to suggest a neuroma.  Normal toes.  LABS:    Chemistry      Component Value Date/Time   NA 141 07/19/2017 1038   K 4.0 07/19/2017 1038   CL 105 07/19/2017 1038   CO2 27 07/19/2017 1038   BUN 19 07/19/2017 1038   CREATININE 0.84 07/19/2017 1038      Component Value Date/Time   CALCIUM 9.2 07/19/2017 1038   ALKPHOS 64 07/19/2017 1038   AST 17 07/19/2017 1038   ALT 29 07/19/2017 1038   BILITOT 0.5 07/19/2017 1038       IMPRESSION AND PLAN:  Metatarsalgia, right foot. Discussed dx, tx, normal duration of sx's. Discussed metatarsal pad use/gave handout with instructions on how to put this in her shoe. Other instructions: Take 600 mg ibuprofen twice a day with food for 14 days. Insert a metatarsal pad in your shoe to off-load the  metatarsal heads. Rest the foot when you can. Avoid high impact exercise. Ice for 20 min per day.  An After Visit Summary was printed and given to the patient.  FOLLOW UP: Return if symptoms worsen or fail to improve in 2 weeks (may call or make appt for office visit).. If not improved, next step is referral to sports med or podiatry.  Signed:  Crissie Sickles, MD           05/23/2018

## 2018-05-23 NOTE — Patient Instructions (Signed)
Take 600 mg ibuprofen twice a day with food for 14 days.  Insert a metatarsal pad in your shoe to off-load the metatarsal heads.  Rest the foot when you can. Avoid high impact exercise. Ice for 20 min per day.

## 2018-05-28 ENCOUNTER — Ambulatory Visit: Payer: No Typology Code available for payment source | Admitting: Podiatry

## 2018-05-29 ENCOUNTER — Encounter: Payer: Self-pay | Admitting: Family Medicine

## 2018-06-04 ENCOUNTER — Telehealth: Payer: Self-pay | Admitting: Family Medicine

## 2018-06-04 DIAGNOSIS — M79671 Pain in right foot: Secondary | ICD-10-CM

## 2018-06-04 DIAGNOSIS — M7741 Metatarsalgia, right foot: Secondary | ICD-10-CM

## 2018-06-04 NOTE — Telephone Encounter (Signed)
Copied from [place] [phone] [phone]. Topic: Quick Communication - See Telephone Encounter >> Jun 04, 2018  4:29 PM Berneta Levins wrote: CRM for notification. See Telephone encounter for: 06/04/18.  Pt was told to call back in two weeks with update on foot pain.  Pt states that it is really no better.  Pt believes that the provider mentioned that he may refer her somewhere if the pain continued.  Pt would like to ask that this process be started.  Pt would prefer to see Dr. Milinda Pointer with [facility]. Pt can be reached at [phone]

## 2018-06-10 NOTE — Telephone Encounter (Signed)
Pt advised and voiced understanding.   

## 2018-06-10 NOTE — Telephone Encounter (Signed)
OK, referral ordered as per pt request. 

## 2018-06-10 NOTE — Telephone Encounter (Signed)
Please advise. Thanks.  

## 2018-06-12 ENCOUNTER — Ambulatory Visit
Admission: RE | Admit: 2018-06-12 | Discharge: 2018-06-12 | Disposition: A | Discharge status: Home / Self Care | Payer: No Typology Code available for payment source | Source: Ambulatory Visit | Attending: Obstetrics and Gynecology | Admitting: Obstetrics and Gynecology

## 2018-06-12 DIAGNOSIS — Z1231 Encounter for screening mammogram for malignant neoplasm of breast: Secondary | ICD-10-CM

## 2018-06-20 ENCOUNTER — Ambulatory Visit (INDEPENDENT_AMBULATORY_CARE_PROVIDER_SITE_OTHER): Payer: No Typology Code available for payment source

## 2018-06-20 ENCOUNTER — Encounter: Payer: Self-pay | Admitting: Podiatry

## 2018-06-20 ENCOUNTER — Ambulatory Visit: Payer: No Typology Code available for payment source | Admitting: Podiatry

## 2018-06-20 VITALS — BP 107/67 | HR 67 | Resp 16

## 2018-06-20 DIAGNOSIS — M779 Enthesopathy, unspecified: Secondary | ICD-10-CM | POA: Diagnosis not present

## 2018-06-20 DIAGNOSIS — M7752 Other enthesopathy of left foot: Secondary | ICD-10-CM

## 2018-06-20 DIAGNOSIS — M778 Other enthesopathies, not elsewhere classified: Secondary | ICD-10-CM

## 2018-06-20 MED ORDER — MELOXICAM 15 MG PO TABS: 15.0000 mg | ORAL_TABLET | Freq: Every day | ORAL | 3 refills | Status: DC

## 2018-06-20 MED ORDER — METHYLPREDNISOLONE 4 MG PO TBPK: ORAL_TABLET | ORAL | 0 refills | Status: DC

## 2018-06-20 MED FILL — MELOXICAM 15 MG TABLET: 15 | 30 days supply | Qty: 30 | Fill #0

## 2018-06-20 MED FILL — METHYLPREDNISOLONE 4 MG TAB: 4 | 6 days supply | Qty: 21 | Fill #0

## 2018-06-20 NOTE — Progress Notes (Signed)
Subjective:  Patient ID: Tracy Moore, female    DOB: 30-Nov-1960,  MRN: [id number] HPI Chief Complaint  Patient presents with  . Foot Pain    Plantar forefoot right and anterior ankle bilateral - aching x 1 year, worse over the last month, walks a lot, tried different shoes, sometimes has sharp, burning, shotting pains in the 2nd, 3rd, 4th toes, tried Ibuprofen and OTC insoles  . New Patient (Initial Visit)    57 y.o. female presents with the above complaint.   ROS: She denies fever chills nausea vomiting muscle aches pains calf pain back pain chest pain shortness of breath.  Past Medical History:  Diagnosis Date  . Allergic rhinitis    seasonal  . Depression   . Melanoma ([place]) 08/2006   Righ thigh (Dr. [place] Crane); in situ--shave excision done, but [facility] did definitive excision: path showed residual melanoma in situ, margins free (11/01/17)  . Thyroid nodule 12/1998   Bx benign.Dr. Dwyane Dee following q1-2 yrs.  Pt has been euthyroid (was on synthroid at one point in time but this was d/c'd per pt).  F/u with endo 07/2016 showed stability so no further w/u recommended, just f/u with endo in 2 yrs.  . Varicose veins    Past Surgical History:  Procedure Laterality Date  . ANAL FISSURE REPAIR    . [place] and 2003  . COLONOSCOPY W/ POLYPECTOMY  08/07/2014   Sessile serrated polyp x 1.  Recall 5 yrs  . KNEE ARTHROSCOPY Left 1992  . thyroid nodule biopsy  2000   Benign    Current Outpatient Medications:  .  Fexofenadine HCl (ALLEGRA PO), Take by mouth., Disp: , Rfl:  .  5-Hydroxytryptophan (5-HTP) 100 MG CAPS, Take by mouth., Disp: , Rfl:  .  acetaminophen (TYLENOL) 325 MG tablet, Take 650 mg by mouth every 6 (six) hours as needed., Disp: , Rfl:  .  ibuprofen (ADVIL,MOTRIN) 200 MG tablet, Take 200 mg by mouth every 6 (six) hours as needed., Disp: , Rfl:  .  levonorgestrel (MIRENA) 20 MCG/24HR IUD, 1 each by Intrauterine route once., Disp: , Rfl:  .  meloxicam  (MOBIC) 15 MG tablet, Take 1 tablet (15 mg total) by mouth daily., Disp: 30 tablet, Rfl: 3 .  methylPREDNISolone (MEDROL DOSEPAK) 4 MG TBPK tablet, 6 day dose pack - take as directed, Disp: 21 tablet, Rfl: 0 .  Misc Natural Products (GRAPE SEED COMPLEX PO), Take by mouth., Disp: , Rfl:  .  SM SUPER B COMPLEX/C PO, Take 1 tablet by mouth daily., Disp: , Rfl:   No Known Allergies Review of Systems Objective:   Vitals:   06/20/18 1021  BP: 107/67  Pulse: 67  Resp: 16    General: Well developed, nourished, in no acute distress, alert and oriented x3   Dermatological: Skin is warm, dry and supple bilateral. Nails x 10 are well maintained; remaining integument appears unremarkable at this time. There are no open sores, no preulcerative lesions, no rash or signs of infection present.  Vascular: Dorsalis Pedis artery and Posterior Tibial artery pedal pulses are 2/4 bilateral with immedate capillary fill time. Pedal hair growth present. No varicosities and no lower extremity edema present bilateral.   Neruologic: Grossly intact via light touch bilateral. Vibratory intact via tuning fork bilateral. Protective threshold with Semmes Wienstein monofilament intact to all pedal sites bilateral. Patellar and Achilles deep tendon reflexes 2+ bilateral. No Babinski or clonus noted bilateral.   Musculoskeletal: No gross boney  pedal deformities bilateral. No pain, crepitus, or limitation noted with foot and ankle range of motion bilateral. Muscular strength 5/5 in all groups tested bilateral.  She has pain on palpation of the second metatarsal phalangeal joint laterally.  She also has medial deviation of the toe medially.  No hammertoe deformity appears to be flexible in nature  Gait: Unassisted, Nonantalgic.    Radiographs:  Radiographs taken today do not demonstrate any type of osseous abnormalities other than elongated second metatarsal and medial deviation of the second toe right foot it appears that  she has fracture of the proximal phalanx of the third toe at some point in time.  There is a small piece of bone fragment in the collateral ligament.  Assessment & Plan:   Assessment: Pre-dislocation syndrome second metatarsal phalangeal joint of the right foot.  Hammertoe deformity third right.  Anterior ankle impingement bilateral.  Plan: Discussed etiology pathology consider surgical therapies at this point start her on a Medrol Dosepak to be followed by meloxicam after a 4 mg injection of Kenalog and local anesthetic.  She tolerated procedure well without complications.     Koray Soter T. [place], [place]

## 2018-07-11 ENCOUNTER — Ambulatory Visit: Payer: No Typology Code available for payment source | Admitting: Orthotics

## 2018-07-11 DIAGNOSIS — Z85828 Personal history of other malignant neoplasm of skin: Secondary | ICD-10-CM

## 2018-07-11 DIAGNOSIS — M779 Enthesopathy, unspecified: Principal | ICD-10-CM

## 2018-07-11 DIAGNOSIS — M778 Other enthesopathies, not elsewhere classified: Secondary | ICD-10-CM

## 2018-07-11 NOTE — Progress Notes (Signed)
Patient came in today to pick up custom made foot orthotics.  The goals were accomplished and the patient reported no dissatisfaction with said orthotics.  Patient was advised of breakin period and how to report any issues.Patient came in today to pick up custom made foot orthotics.  The goals were accomplished and the patient reported no dissatisfaction with said orthotics.  Patient was advised of breakin period and how to report any issues. 

## 2018-07-16 MED FILL — MELOXICAM 15 MG TABLET: 15 | 30 days supply | Qty: 30 | Fill #1

## 2018-07-23 ENCOUNTER — Ambulatory Visit: Payer: No Typology Code available for payment source | Admitting: Podiatry

## 2018-07-23 ENCOUNTER — Telehealth: Payer: Self-pay | Admitting: Podiatry

## 2018-07-23 ENCOUNTER — Encounter: Payer: Self-pay | Admitting: Podiatry

## 2018-07-23 DIAGNOSIS — M778 Other enthesopathies, not elsewhere classified: Secondary | ICD-10-CM

## 2018-07-23 DIAGNOSIS — M779 Enthesopathy, unspecified: Secondary | ICD-10-CM | POA: Diagnosis not present

## 2018-07-23 MED ORDER — CELECOXIB 200 MG PO CAPS: 200.0000 mg | ORAL_CAPSULE | Freq: Every day | ORAL | 3 refills | Status: DC

## 2018-07-23 MED ORDER — CELECOXIB 200 MG PO CAPS
200.0000 mg | ORAL_CAPSULE | Freq: Every day | ORAL | 3 refills | Status: DC
Start: 2018-07-23 — End: 2019-06-25

## 2018-07-23 MED FILL — CELECOXIB 200 MG CAP: 200 | 30 days supply | Qty: 30 | Fill #0

## 2018-07-23 NOTE — Telephone Encounter (Signed)
Pt was seen today and had medication sent to pharmacy but it was sent to the wrong one. The prescription should have been sent to the [facility] on [address].

## 2018-07-23 NOTE — Addendum Note (Signed)
Addended by: Harriett Sine D on: 07/23/2018 03:22 PM   Modules accepted: Orders

## 2018-07-23 NOTE — Telephone Encounter (Signed)
I informed pt the celebrex had be sent to the [facility].

## 2018-07-24 NOTE — Progress Notes (Signed)
She presents today for follow-up of capsulitis second metatarsal phalangeal joint of the right foot.  States that is better than it was but I had to stop the meloxicam because it caused canker sores on the lip.  She relates approximately 80% improvement.  Objective: Vital signs are stable alert and oriented x3.  She has very little in the way of tenderness on the range of motion of the second metatarsal phalangeal joint of the right foot.  No erythema cellulitis drainage or odor swelling to the plantar aspect of the foot appears to be resolving nicely.  Assessment: Resolving capsulitis second metatarsal phalangeal joint right foot pre-dislocation syndrome.  Plan: Discussed etiology pathology and surgical therapies at this point time continue use of the orthotics I changed her meloxicam to Celebrex and I will follow-up with her in about 6 weeks or so.

## 2018-08-01 ENCOUNTER — Ambulatory Visit: Payer: No Typology Code available for payment source | Admitting: Podiatry

## 2018-08-05 ENCOUNTER — Telehealth: Payer: Self-pay | Admitting: Podiatry

## 2018-08-05 NOTE — Telephone Encounter (Signed)
Pt called and would like you to call her to discuss a second pair of orthotics.

## 2018-08-08 NOTE — Telephone Encounter (Signed)
Called to discuss 2nd pair; no answer at home, work said she wasn't there today, left msg on cell phone to call.

## 2018-08-12 ENCOUNTER — Telehealth: Payer: Self-pay | Admitting: Podiatry

## 2018-08-12 NOTE — Telephone Encounter (Signed)
Pt returned Rick's call and left a message on my voicemail on 1.30.2020 @ 10.11 asking for Liliane Channel to call her back.

## 2018-08-21 MED FILL — CELECOXIB 200 MG CAP: 200 | 30 days supply | Qty: 30 | Fill #1

## 2018-08-27 NOTE — Telephone Encounter (Signed)
Called to discuss second pair of f/o.Marland Kitchengave her my cell-number.

## 2018-09-12 ENCOUNTER — Encounter: Payer: Self-pay | Admitting: Podiatry

## 2018-09-12 ENCOUNTER — Ambulatory Visit: Payer: No Typology Code available for payment source | Admitting: Podiatry

## 2018-09-12 DIAGNOSIS — M778 Other enthesopathies, not elsewhere classified: Secondary | ICD-10-CM

## 2018-09-12 DIAGNOSIS — M779 Enthesopathy, unspecified: Secondary | ICD-10-CM

## 2018-09-12 NOTE — Progress Notes (Signed)
She presents today for follow-up of capsulitis second metatarsal phalangeal joint of the right foot states that is better than it was she states is about 85 to 90% improved continues to take her Celebrex on a regular basis wearing her orthotics in her tennis shoes most of the time.  Objective: Vital signs are stable alert and oriented x3.  She has very little pain on palpation of the second and third metatarsophalangeal joints a little bit thick little bit swollen in that area but no pain.  Pulses are palpable neurologic sensorium is intact.  DP reflexes are intact.  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  Plan: At this point I will request that she continue all conservative therapies including her Celebrex and she would like to purchase another pair of orthotics.

## 2018-09-18 ENCOUNTER — Telehealth: Payer: Self-pay | Admitting: Podiatry

## 2018-09-18 NOTE — Telephone Encounter (Signed)
Left message for pt to call to discuss the 2nd pair of orthotics.

## 2018-09-19 MED FILL — CELECOXIB 200 MG CAP: 200 | 30 days supply | Qty: 30 | Fill #2

## 2018-09-21 ENCOUNTER — Ambulatory Visit (INDEPENDENT_AMBULATORY_CARE_PROVIDER_SITE_OTHER): Payer: Self-pay | Admitting: Nurse Practitioner

## 2018-09-21 VITALS — BP 130/80 | HR 78 | Temp 98.2°F | Resp 14 | Wt 176.4 lb

## 2018-09-21 DIAGNOSIS — B9789 Other viral agents as the cause of diseases classified elsewhere: Secondary | ICD-10-CM

## 2018-09-21 DIAGNOSIS — J019 Acute sinusitis, unspecified: Secondary | ICD-10-CM

## 2018-09-21 MED ORDER — PSEUDOEPH-BROMPHEN-DM 30-2-10 MG/5ML PO SYRP: 5.0000 mL | ORAL_SOLUTION | Freq: Four times a day (QID) | ORAL | 0 refills | Status: AC | PRN

## 2018-09-21 MED ORDER — MONTELUKAST SODIUM 10 MG PO TABS: 10.0000 mg | ORAL_TABLET | Freq: Every day | ORAL | 0 refills | Status: DC

## 2018-09-21 MED ORDER — AMOXICILLIN-POT CLAVULANATE 875-125 MG PO TABS: 1.0000 | ORAL_TABLET | Freq: Two times a day (BID) | ORAL | 0 refills | Status: AC

## 2018-09-21 MED ORDER — FLUTICASONE PROPIONATE 50 MCG/ACT NA SUSP: 2.0000 | Freq: Every day | NASAL | 0 refills | Status: DC

## 2018-09-21 MED FILL — FLUTICASONE PROP 50 MCG SPR: 50 | 30 days supply | Qty: 16 | Fill #0

## 2018-09-21 MED FILL — MONTELUKAST SOD 10 MG TAB: 10 | 30 days supply | Qty: 30 | Fill #0

## 2018-09-21 MED FILL — BROMPHENIR-PSEUDOEPHED-DM S: 30-2-10 | 7 days supply | Qty: 150 | Fill #0

## 2018-09-21 NOTE — Progress Notes (Signed)
MRN: [id number] DOB: 1960-10-26  Subjective:   Tracy Moore is a 58 y.o. female presenting for chief complaint of nasal drainage and sinus pressure w/cough (x9days (zyrtec and mucinex) honey) .  Reports 9 day history of sinus headache, sinus congestion , rhinorrhea, itchy watery eyes, ear fullness and dry cough, fatigue, postnasal drip. Has tried Zyrtec, Mucinex and honey for relief. Denies ear drainage, difficulty swallowing, pain with swallowing, inability to swallow, productive cough, wheezing, shortness of breath, chest tightness, chest pain and myalgia, malaise, decreased appetite, nausea, vomiting, abdominal pain and diarrhea. Patient feels her symptoms are not improving. Has not had sick contact with influenza, strep or COVID-19.  Denies recent travel. Admits to a history of seasonal allergies, denies history of asthma, bronchitis or pneumonia. Patient has had a flu shot this season. Denies smoking. Denies any other aggravating or relieving factors, no other questions or concerns.  Review of Systems  Constitutional: Positive for malaise/fatigue. Negative for chills and fever.  HENT: Positive for congestion, sinus pain and sore throat (2/2 PND).        Bilateral ear fullness, pressure, + hoarseness  Eyes: Positive for redness. Negative for blurred vision, double vision, pain and discharge.       Itchy eyes  Respiratory: Positive for cough. Negative for sputum production, shortness of breath and wheezing.   Cardiovascular: Negative.   Gastrointestinal: Negative.   Neurological: Positive for headaches. Negative for dizziness, tremors, speech change, focal weakness, seizures and weakness.  Endo/Heme/Allergies: Positive for environmental allergies.    Tracy Moore has a current medication list which includes the following prescription(s): 5-htp, celecoxib, fexofenadine hcl, levonorgestrel, b complex-c, acetaminophen, ibuprofen, and misc natural products. Also is allergic to  meloxicam.  Tracy Moore  has a past medical history of Allergic rhinitis, Depression, Melanoma ([place]) (08/2006), Thyroid nodule (12/1998), and Varicose veins. Also  has a past surgical history that includes Cesarean section (1997 and 2003); Knee arthroscopy (Left, 1992); thyroid nodule biopsy (2000); Anal fissure repair; and Colonoscopy w/ polypectomy (08/07/2014).   Objective:   Vitals: BP 130/80   Pulse 78   Temp 98.2 F (36.8 C)   Resp 14   Wt 176 lb 6.4 oz (80 kg)   SpO2 97%   BMI 28.47 kg/m   Physical Exam Vitals signs reviewed.  Constitutional:      General: She is not in acute distress. HENT:     Head: Normocephalic.     Right Ear: Tympanic membrane, ear canal and external ear normal.     Left Ear: Tympanic membrane, ear canal and external ear normal.     Nose: Mucosal edema, congestion (moderate) and rhinorrhea (clear nasal drainage) present.     Right Turbinates: Enlarged and swollen.     Left Turbinates: Enlarged and swollen.     Right Sinus: No maxillary sinus tenderness or frontal sinus tenderness.     Left Sinus: No maxillary sinus tenderness or frontal sinus tenderness.     Mouth/Throat:     Lips: Pink.     Mouth: Mucous membranes are moist.     Pharynx: Posterior oropharyngeal erythema present. No pharyngeal swelling, oropharyngeal exudate or uvula swelling.     Tonsils: No tonsillar exudate. Swelling: 0 on the right. 0 on the left.  Eyes:     Conjunctiva/sclera: Conjunctivae normal.     Pupils: Pupils are equal, round, and reactive to light.  Neck:     Musculoskeletal: Normal range of motion and neck supple.  Cardiovascular:     Rate and Rhythm:  Normal rate and regular rhythm.     Pulses: Normal pulses.     Heart sounds: Normal heart sounds.  Pulmonary:     Effort: Pulmonary effort is normal. No respiratory distress.     Breath sounds: Normal breath sounds. No stridor. No wheezing, rhonchi or rales.  Abdominal:     General: Bowel sounds are normal.      Palpations: Abdomen is soft.     Tenderness: There is no abdominal tenderness.  Lymphadenopathy:     Cervical: No cervical adenopathy.  Skin:    General: Skin is warm and dry.     Capillary Refill: Capillary refill takes less than 2 seconds.  Neurological:     General: No focal deficit present.     Mental Status: She is alert and oriented to person, place, and time.  Psychiatric:        Mood and Affect: Mood normal.        Behavior: Behavior normal.    Assessment and Plan :   Exam findings, diagnosis etiology and medication use and indications reviewed with patient. Follow- Up and discharge instructions provided. No emergent/urgent issues found on exam.  At this time I feel patient's symptoms are borderline viral/bacterial.  The patient's symptoms have been present for 9 days.  I would like to try her on additional symptomatic treatment for another 3 days to see if this helps her symptoms before immediately moving to an antibiotic.  The patient does not have facial congestion or fullness, facial pain, or purulent nasal drainage.  The patient has not had fever, but does has clear nasal drainage, has had continued postnasal drip, and no worsening of symptoms, I am going to provide prescriptions for Flonase, Bromfed, and adding Singulair to her regimen.  Informed the patient that I would like her to try this for 3 days; however, if she has no improvement in her symptoms I would like her to have the prescription provided on 3/17.  The patient was in agreement with this treatment plan.  The patient is well-appearing, is in no acute distress, and vital signs are stable at this time.  Patient education was provided. Patient verbalized understanding of information provided and agrees with plan of care (POC), all questions answered. The patient is advised to call or return to clinic if condition does not see an improvement in symptoms, or to seek the care of the closest emergency department if condition  worsens with the above plan.   1. Acute viral sinusitis  - fluticasone (FLONASE) 50 MCG/ACT nasal spray; Place 2 sprays into both nostrils daily for 10 days.  Dispense: 16 g; Refill: 0 - montelukast (SINGULAIR) 10 MG tablet; Take 1 tablet (10 mg total) by mouth at bedtime for 30 days.  Dispense: 30 tablet; Refill: 0 - brompheniramine-pseudoephedrine-DM 30-2-10 MG/5ML syrup; Take 5 mLs by mouth 4 (four) times daily as needed for up to 7 days.  Dispense: 150 mL; Refill: 0 - amoxicillin-clavulanate (AUGMENTIN) 875-125 MG tablet; Take 1 tablet by mouth 2 (two) times daily for 7 days.  Dispense: 14 tablet; Refill: 0- fill in 3 days if no improvement of symptoms -Take medication as prescribed. -Ibuprofen or Tylenol for pain, fever, or general discomfort. -You may continue to take your Zyrtec each morning. -Increase fluids. -Get plenty of rest. -Sleep elevated on at least 2 pillows at bedtime to help with cough. -Use a humidifier or vaporizer when at home and during sleep. -May use a teaspoon of honey or over-the-counter cough drops  to help with cough. -May use normal saline nasal spray to help with nasal congestion throughout the day. -If your symptoms do not improve within the next 3-5 days, have the prescription filled for Augmentin.  You will take this medication twice daily for 7 days. -Follow up with your PCP if no improvement if you have taken the antibiotic.

## 2018-09-21 NOTE — Patient Instructions (Signed)
Sinusitis, Adult -Take medication as prescribed. -Ibuprofen or Tylenol for pain, fever, or general discomfort. -You may continue to take your Zyrtec each morning. -Increase fluids. -Get plenty of rest. -Sleep elevated on at least 2 pillows at bedtime to help with cough. -Use a humidifier or vaporizer when at home and during sleep. -May use a teaspoon of honey or over-the-counter cough drops to help with cough. -May use normal saline nasal spray to help with nasal congestion throughout the day. -If your symptoms do not improve within the next 3-5 days, have the prescription filled for Augmentin.  You will take this medication twice daily for 7 days. -Follow up with your PCP if no improvement if you have taken the antibiotic.  Sinusitis is inflammation of your sinuses. Sinuses are hollow spaces in the bones around your face. Your sinuses are located:  Around your eyes.  In the middle of your forehead.  Behind your nose.  In your cheekbones. Mucus normally drains out of your sinuses. When your nasal tissues become inflamed or swollen, mucus can become trapped or blocked. This allows bacteria, viruses, and fungi to grow, which leads to infection. Most infections of the sinuses are caused by a virus. Sinusitis can develop quickly. It can last for up to 4 weeks (acute) or for more than 12 weeks (chronic). Sinusitis often develops after a cold. What are the causes? This condition is caused by anything that creates swelling in the sinuses or stops mucus from draining. This includes:  Allergies.  Asthma.  Infection from bacteria or viruses.  Deformities or blockages in your nose or sinuses.  Abnormal growths in the nose (nasal polyps).  Pollutants, such as chemicals or irritants in the air.  Infection from fungi (rare). What increases the risk? You are more likely to develop this condition if you:  Have a weak body defense system (immune system).  Do a lot of swimming or diving.   Overuse nasal sprays.  Smoke. What are the signs or symptoms? The main symptoms of this condition are pain and a feeling of pressure around the affected sinuses. Other symptoms include:  Stuffy nose or congestion.  Thick drainage from your nose.  Swelling and warmth over the affected sinuses.  Headache.  Upper toothache.  A cough that may get worse at night.  Extra mucus that collects in the throat or the back of the nose (postnasal drip).  Decreased sense of smell and taste.  Fatigue.  A fever.  Sore throat.  Bad breath. How is this diagnosed? This condition is diagnosed based on:  Your symptoms.  Your medical history.  A physical exam.  Tests to find out if your condition is acute or chronic. This may include: ? Checking your nose for nasal polyps. ? Viewing your sinuses using a device that has a light (endoscope). ? Testing for allergies or bacteria. ? Imaging tests, such as an MRI or CT scan. In rare cases, a bone biopsy may be done to rule out more serious types of fungal sinus disease. How is this treated? Treatment for sinusitis depends on the cause and whether your condition is chronic or acute.  If caused by a virus, your symptoms should go away on their own within 10 days. You may be given medicines to relieve symptoms. They include: ? Medicines that shrink swollen nasal passages (topical intranasal decongestants). ? Medicines that treat allergies (antihistamines). ? A spray that eases inflammation of the nostrils (topical intranasal corticosteroids). ? Rinses that help get rid of  thick mucus in your nose (nasal saline washes).  If caused by bacteria, your health care provider may recommend waiting to see if your symptoms improve. Most bacterial infections will get better without antibiotic medicine. You may be given antibiotics if you have: ? A severe infection. ? A weak immune system.  If caused by narrow nasal passages or nasal polyps, you may  need to have surgery. Follow these instructions at home: Medicines  Take, use, or apply over-the-counter and prescription medicines only as told by your health care provider. These may include nasal sprays.  If you were prescribed an antibiotic medicine, take it as told by your health care provider. Do not stop taking the antibiotic even if you start to feel better. Hydrate and humidify   Drink enough fluid to keep your urine pale yellow. Staying hydrated will help to thin your mucus.  Use a cool mist humidifier to keep the humidity level in your home above 50%.  Inhale steam for 10-15 minutes, 3-4 times a day, or as told by your health care provider. You can do this in the bathroom while a hot shower is running.  Limit your exposure to cool or dry air. Rest  Rest as much as possible.  Sleep with your head raised (elevated).  Make sure you get enough sleep each night. General instructions   Apply a warm, moist washcloth to your face 3-4 times a day or as told by your health care provider. This will help with discomfort.  Wash your hands often with soap and water to reduce your exposure to germs. If soap and water are not available, use hand sanitizer.  Do not smoke. Avoid being around people who are smoking (secondhand smoke).  Keep all follow-up visits as told by your health care provider. This is important. Contact a health care provider if:  You have a fever.  Your symptoms get worse.  Your symptoms do not improve within 10 days. Get help right away if:  You have a severe headache.  You have persistent vomiting.  You have severe pain or swelling around your face or eyes.  You have vision problems.  You develop confusion.  Your neck is stiff.  You have trouble breathing. Summary  Sinusitis is soreness and inflammation of your sinuses. Sinuses are hollow spaces in the bones around your face.  This condition is caused by nasal tissues that become inflamed  or swollen. The swelling traps or blocks the flow of mucus. This allows bacteria, viruses, and fungi to grow, which leads to infection.  If you were prescribed an antibiotic medicine, take it as told by your health care provider. Do not stop taking the antibiotic even if you start to feel better.  Keep all follow-up visits as told by your health care provider. This is important. This information is not intended to replace advice given to you by your health care provider. Make sure you discuss any questions you have with your health care provider. Document Released: 06/26/2005 Document Revised: 11/26/2017 Document Reviewed: 11/26/2017 Elsevier Interactive Patient Education  2019 [employer].

## 2018-09-23 ENCOUNTER — Telehealth: Payer: Self-pay

## 2018-09-23 NOTE — Telephone Encounter (Signed)
Patient states she still have the cough, is her throat feel irritated. After I spoke with the provider I told the patient she can go tomorrow to the pharmacy and have her antibiotic prescription, as the provider told her to wait for 3 days before grab antibiotic and if no improvement after the antibiotic treatment she needs to follow up with her PCP.

## 2018-09-24 MED FILL — AMOX-CLAV 875-125 MG TABLET: 875-125 | 7 days supply | Qty: 14 | Fill #0

## 2018-12-19 ENCOUNTER — Telehealth: Payer: Self-pay | Admitting: Podiatry

## 2018-12-19 NOTE — Telephone Encounter (Signed)
Pt lvm stating she was to have a second pair of orthotics ordered a couple of months ago and she has not received them.  I returned call and pt is wanting the second pair exactly like the ones she had made.I apologized and will have Liliane Channel order them today and I will call when they come in.

## 2019-02-12 ENCOUNTER — Encounter: Payer: No Typology Code available for payment source | Admitting: Family Medicine

## 2019-05-28 ENCOUNTER — Other Ambulatory Visit: Payer: Self-pay | Admitting: Obstetrics and Gynecology

## 2019-05-28 DIAGNOSIS — Z1231 Encounter for screening mammogram for malignant neoplasm of breast: Secondary | ICD-10-CM

## 2019-06-10 DIAGNOSIS — M20011 Mallet finger of right finger(s): Secondary | ICD-10-CM

## 2019-06-10 HISTORY — DX: Mallet finger of right finger(s): M20.011

## 2019-06-13 ENCOUNTER — Encounter: Payer: Self-pay | Admitting: Family Medicine

## 2019-06-13 ENCOUNTER — Ambulatory Visit (INDEPENDENT_AMBULATORY_CARE_PROVIDER_SITE_OTHER): Payer: No Typology Code available for payment source | Admitting: Family Medicine

## 2019-06-13 ENCOUNTER — Other Ambulatory Visit: Payer: Self-pay

## 2019-06-13 VITALS — BP 93/65 | HR 58 | Temp 98.2°F | Resp 16 | Ht 65.0 in | Wt 157.4 lb

## 2019-06-13 DIAGNOSIS — Z8639 Personal history of other endocrine, nutritional and metabolic disease: Secondary | ICD-10-CM | POA: Diagnosis not present

## 2019-06-13 DIAGNOSIS — Z0000 Encounter for general adult medical examination without abnormal findings: Secondary | ICD-10-CM | POA: Diagnosis not present

## 2019-06-13 DIAGNOSIS — Z1231 Encounter for screening mammogram for malignant neoplasm of breast: Secondary | ICD-10-CM

## 2019-06-13 DIAGNOSIS — E785 Hyperlipidemia, unspecified: Secondary | ICD-10-CM | POA: Diagnosis not present

## 2019-06-13 DIAGNOSIS — E663 Overweight: Secondary | ICD-10-CM

## 2019-06-13 LAB — LIPID PANEL
Cholesterol: 161 mg/dL (ref 0–200)
HDL: 46.5 mg/dL (ref >39.00)
LDL Cholesterol: 103 mg/dL — ABNORMAL HIGH (ref 0–99)
NonHDL: 114.45
Total CHOL/HDL Ratio: 3
Triglycerides: 55 mg/dL (ref 0.0–149.0)
VLDL: 11 mg/dL (ref 0.0–40.0)

## 2019-06-13 LAB — CBC WITH DIFFERENTIAL/PLATELET
Basophils Absolute: 0 10*3/uL (ref 0.0–0.1)
Basophils Relative: 0.7 % (ref 0.0–3.0)
Eosinophils Absolute: 0 10*3/uL (ref 0.0–0.7)
Eosinophils Relative: 1.2 % (ref 0.0–5.0)
HCT: 40 % (ref 36.0–46.0)
Hemoglobin: 13.2 g/dL (ref 12.0–15.0)
Lymphocytes Relative: 34.4 % (ref 12.0–46.0)
Lymphs Abs: 1.5 10*3/uL (ref 0.7–4.0)
MCHC: 32.9 g/dL (ref 30.0–36.0)
MCV: 91.6 fl (ref 78.0–100.0)
Monocytes Absolute: 0.4 10*3/uL (ref 0.1–1.0)
Monocytes Relative: 8.4 % (ref 3.0–12.0)
Neutro Abs: 2.4 10*3/uL (ref 1.4–7.7)
Neutrophils Relative %: 55.3 % (ref 43.0–77.0)
Platelets: 264 10*3/uL (ref 150.0–400.0)
RBC: 4.37 Mil/uL (ref 3.87–5.11)
RDW: 13.7 % (ref 11.5–15.5)
WBC: 4.3 10*3/uL (ref 4.0–10.5)

## 2019-06-13 LAB — COMPREHENSIVE METABOLIC PANEL
ALT: 31 U/L (ref 0–35)
AST: 20 U/L (ref 0–37)
Albumin: 4.3 g/dL (ref 3.5–5.2)
Alkaline Phosphatase: 67 U/L (ref 39–117)
BUN: 16 mg/dL (ref 6–23)
CO2: 25 mEq/L (ref 19–32)
Calcium: 9.2 mg/dL (ref 8.4–10.5)
Chloride: 105 mEq/L (ref 96–112)
Creatinine, Ser: 0.77 mg/dL (ref 0.40–1.20)
GFR: 77.34 mL/min (ref >60.00)
Glucose, Bld: 78 mg/dL (ref 70–99)
Potassium: 4.3 mEq/L (ref 3.5–5.1)
Sodium: 139 mEq/L (ref 135–145)
Total Bilirubin: 0.5 mg/dL (ref 0.2–1.2)
Total Protein: 6.9 g/dL (ref 6.0–8.3)

## 2019-06-13 LAB — TSH: TSH: 0.5 u[IU]/mL (ref 0.35–4.50)

## 2019-06-13 NOTE — Patient Instructions (Signed)
Health Maintenance, Female Adopting a healthy lifestyle and getting preventive care are important in promoting health and wellness. Ask your health care provider about:  The right schedule for you to have regular tests and exams.  Things you can do on your own to prevent diseases and keep yourself healthy. What should I know about diet, weight, and exercise? Eat a healthy diet   Eat a diet that includes plenty of vegetables, fruits, low-fat dairy products, and lean protein.  Do not eat a lot of foods that are high in solid fats, added sugars, or sodium. Maintain a healthy weight Body mass index (BMI) is used to identify weight problems. It estimates body fat based on height and weight. Your health care provider can help determine your BMI and help you achieve or maintain a healthy weight. Get regular exercise Get regular exercise. This is one of the most important things you can do for your health. Most adults should:  Exercise for at least 150 minutes each week. The exercise should increase your heart rate and make you sweat (moderate-intensity exercise).  Do strengthening exercises at least twice a week. This is in addition to the moderate-intensity exercise.  Spend less time sitting. Even light physical activity can be beneficial. Watch cholesterol and blood lipids Have your blood tested for lipids and cholesterol at 56 years of age, then have this test every 5 years. Have your cholesterol levels checked more often if:  Your lipid or cholesterol levels are high.  You are older than 56 years of age.  You are at high risk for heart disease. What should I know about cancer screening? Depending on your health history and family history, you may need to have cancer screening at various ages. This may include screening for:  Breast cancer.  Cervical cancer.  Colorectal cancer.  Skin cancer.  Lung cancer. What should I know about heart disease, diabetes, and high blood  pressure? Blood pressure and heart disease  High blood pressure causes heart disease and increases the risk of stroke. This is more likely to develop in people who have high blood pressure readings, are of African descent, or are overweight.  Have your blood pressure checked: ? Every 3-5 years if you are 18-39 years of age. ? Every year if you are 40 years old or older. Diabetes Have regular diabetes screenings. This checks your fasting blood sugar level. Have the screening done:  Once every three years after age 40 if you are at a normal weight and have a low risk for diabetes.  More often and at a younger age if you are overweight or have a high risk for diabetes. What should I know about preventing infection? Hepatitis B If you have a higher risk for hepatitis B, you should be screened for this virus. Talk with your health care provider to find out if you are at risk for hepatitis B infection. Hepatitis C Testing is recommended for:  Everyone born from 1945 through 1965.  Anyone with known risk factors for hepatitis C. Sexually transmitted infections (STIs)  Get screened for STIs, including gonorrhea and chlamydia, if: ? You are sexually active and are younger than 56 years of age. ? You are older than 56 years of age and your health care provider tells you that you are at risk for this type of infection. ? Your sexual activity has changed since you were last screened, and you are at increased risk for chlamydia or gonorrhea. Ask your health care provider if   you are at risk.  Ask your health care provider about whether you are at high risk for HIV. Your health care provider may recommend a prescription medicine to help prevent HIV infection. If you choose to take medicine to prevent HIV, you should first get tested for HIV. You should then be tested every 3 months for as long as you are taking the medicine. Pregnancy  If you are about to stop having your period (premenopausal) and  you may become pregnant, seek counseling before you get pregnant.  Take 400 to 800 micrograms (mcg) of folic acid every day if you become pregnant.  Ask for birth control (contraception) if you want to prevent pregnancy. Osteoporosis and menopause Osteoporosis is a disease in which the bones lose minerals and strength with aging. This can result in bone fractures. If you are 65 years old or older, or if you are at risk for osteoporosis and fractures, ask your health care provider if you should:  Be screened for bone loss.  Take a calcium or vitamin D supplement to lower your risk of fractures.  Be given hormone replacement therapy (HRT) to treat symptoms of menopause. Follow these instructions at home: Lifestyle  Do not use any products that contain nicotine or tobacco, such as cigarettes, e-cigarettes, and chewing tobacco. If you need help quitting, ask your health care provider.  Do not use street drugs.  Do not share needles.  Ask your health care provider for help if you need support or information about quitting drugs. Alcohol use  Do not drink alcohol if: ? Your health care provider tells you not to drink. ? You are pregnant, may be pregnant, or are planning to become pregnant.  If you drink alcohol: ? Limit how much you use to 0-1 drink a day. ? Limit intake if you are breastfeeding.  Be aware of how much alcohol is in your drink. In the [place] one drink equals one 12 oz bottle of beer (355 mL), one 5 oz glass of wine (148 mL), or one 1 oz glass of hard liquor (44 mL). General instructions  Schedule regular health, dental, and eye exams.  Stay current with your vaccines.  Tell your health care provider if: ? You often feel depressed. ? You have ever been abused or do not feel safe at home. Summary  Adopting a healthy lifestyle and getting preventive care are important in promoting health and wellness.  Follow your health care provider's instructions about healthy  diet, exercising, and getting tested or screened for diseases.  Follow your health care provider's instructions on monitoring your cholesterol and blood pressure. This information is not intended to replace advice given to you by your health care provider. Make sure you discuss any questions you have with your health care provider. Document Released: 01/09/2011 Document Revised: 06/19/2018 Document Reviewed: 06/19/2018 Elsevier Patient Education  2020 Elsevier Inc.  

## 2019-06-13 NOTE — Progress Notes (Signed)
Office Note 06/13/2019  CC:  Chief Complaint  Patient presents with  . Annual Exam    pt is fasting   HPI:  Tracy Moore is a 58 y.o. White female who is here for annual health maintenance exam.  Has purposefully lost 20 lbs in the last 9 mo: doing a strict/regimented program called [place]. Walking some, 7K steps/day.  She's shooting for wt in the 140s or so. Feeling good, much more energy.  Pt does have long hx of solitary thyroid nodule, is due for routine f/u with her endocrinologist, Dr. Dwyane Dee. She does not feeling like her nodule has changed size over the last few years.  NO dysphagia or neck pressure.   Past Medical History:  Diagnosis Date  . Allergic rhinitis    seasonal  . Depression   . 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 Nevada Crane); in situ--shave excision done, but [facility] did definitive excision: path showed residual melanoma in situ, margins free (11/01/17)  . Thyroid nodule 12/1998   Bx benign.Dr. Dwyane Dee following q1-2 yrs.  Pt has been euthyroid (was on synthroid at one point in time but this was d/c'd per pt).  F/u with endo 07/2016 showed stability so no further w/u recommended, just f/u with endo (Dr. Dwyane Dee) in 2 yrs.  . Varicose veins     Past Surgical History:  Procedure Laterality Date  . ANAL FISSURE REPAIR    . [place] and 2003  . COLONOSCOPY W/ POLYPECTOMY  08/07/2014   Sessile serrated polyp x 1.  Recall 5 yrs  . KNEE ARTHROSCOPY Left 1992  . thyroid nodule biopsy  2000   Benign    Family History  Problem Relation Age of Onset  . Arthritis Mother   . Hypertension Father   . Arthritis Father   . Heart disease Father   . Thyroid disease Father   . Autism Son   . Dementia Maternal Grandmother   . Cancer Maternal Grandfather        prostate  . Cancer Paternal Grandfather        pancreatic cancer  . Colon cancer Paternal Grandfather 89  . Breast cancer Maternal Aunt     Social History   Socioeconomic  History  . Marital status: Married    Spouse name: Not on file  . Number of children: Not on file  . Years of education: Not on file  . Highest education level: Not on file  Occupational History  . Not on file  Social Needs  . Financial resource strain: Not on file  . Food insecurity    Worry: Not on file    Inability: Not on file  . Transportation needs    Medical: Not on file    Non-medical: Not on file  Tobacco Use  . Smoking status: Never Smoker  . Smokeless tobacco: Never Used  Substance and Sexual Activity  . Alcohol use: Yes    Alcohol/week: 0.0 standard drinks    Comment: socially  . Drug use: No  . Sexual activity: Not on file  Lifestyle  . Physical activity    Days per week: Not on file    Minutes per session: Not on file  . Stress: Not on file  Relationships  . Social Herbalist on phone: Not on file    Gets together: Not on file    Attends religious service: Not on file    Active member of club or organization:  Not on file    Attends meetings of clubs or organizations: Not on file    Relationship status: Not on file  . Intimate partner violence    Fear of current or ex partner: Not on file    Emotionally abused: Not on file    Physically abused: Not on file    Forced sexual activity: Not on file  Other Topics Concern  . Not on file  Social History Narrative   Married, daughter 49 y/o , son 43 y/o (has autism).   Lives in [place].   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  No drugs.    Outpatient Medications Prior to Visit  Medication Sig Dispense Refill  . 5-Hydroxytryptophan (5-HTP) 100 MG CAPS Take by mouth.    Marland Kitchen acetaminophen (TYLENOL) 325 MG tablet Take 650 mg by mouth every 6 (six) hours as needed.    . Ascorbic Acid (VITAMIN C GUMMIE PO) Take 250 mg by mouth 2 (two) times daily.    Marland Kitchen CETIRIZINE HCL PO Take 10 mg by mouth daily.    Marland Kitchen ELDERBERRY PO Take 50 mg by mouth daily.    . fluticasone (FLONASE) 50  MCG/ACT nasal spray Place 2 sprays into both nostrils daily for 10 days. 16 g 0  . ibuprofen (ADVIL,MOTRIN) 200 MG tablet Take 200 mg by mouth every 6 (six) hours as needed.    Marland Kitchen levonorgestrel (MIRENA) 20 MCG/24HR IUD 1 each by Intrauterine route once.    . Multiple Vitamins-Minerals (HAIR SKIN NAILS PO) Take by mouth 3 (three) times daily.    Marland Kitchen SM SUPER B COMPLEX/C PO Take 1 tablet by mouth daily.    . celecoxib (CELEBREX) 200 MG capsule Take 1 capsule (200 mg total) by mouth daily. (Patient not taking: Reported on 06/13/2019) 30 capsule 3  . Fexofenadine HCl (ALLEGRA PO) Take by mouth.    . Misc Natural Products (GRAPE SEED COMPLEX PO) Take by mouth.    . montelukast (SINGULAIR) 10 MG tablet Take 1 tablet (10 mg total) by mouth at bedtime for 30 days. 30 tablet 0   No facility-administered medications prior to visit.     Allergies  Allergen Reactions  . Meloxicam     Canker sores    ROS Review of Systems  Constitutional: Negative for appetite change, chills, fatigue and fever.  HENT: Negative for congestion, dental problem, ear pain and sore throat.   Eyes: Negative for discharge, redness and visual disturbance.  Respiratory: Negative for cough, chest tightness, shortness of breath and wheezing.   Cardiovascular: Negative for chest pain, palpitations and leg swelling.  Gastrointestinal: Negative for abdominal pain, blood in stool, diarrhea, nausea and vomiting.  Genitourinary: Negative for difficulty urinating, dysuria, flank pain, frequency, hematuria and urgency.  Musculoskeletal: Negative for arthralgias, back pain, joint swelling, myalgias and neck stiffness.  Skin: Negative for pallor and rash.  Neurological: Negative for dizziness, speech difficulty, weakness and headaches.  Hematological: Negative for adenopathy. Does not bruise/bleed easily.  Psychiatric/Behavioral: Negative for confusion and sleep disturbance. The patient is not nervous/anxious.     PE; Blood pressure  93/65, pulse (!) 58, temperature 98.2 F (36.8 C), temperature source Temporal, resp. rate 16, height 5\' 5"  (1.651 m), weight 157 lb 6.4 oz (71.4 kg), SpO2 96 %. Body mass index is 26.19 kg/m. Exam chaperoned by Deveron Furlong, CMA.  Gen: Alert, well appearing.  Patient is oriented to person, place, time, and situation. AFFECT: pleasant, lucid thought and  speech. ENT: Ears: EACs clear, normal epithelium.  TMs with good light reflex and landmarks bilaterally.  Eyes: no injection, icteris, swelling, or exudate.  EOMI, PERRLA. Nose: no drainage or turbinate edema/swelling.  No injection or focal lesion.  Mouth: lips without lesion/swelling.  Oral mucosa pink and moist.  Dentition intact and without obvious caries or gingival swelling.  Oropharynx without erythema, exudate, or swelling.  Neck: supple/nontender.  No LAD. A 2 cm rubbery nodule is palpable in L thyroid lobe, nontender.    Carotid pulses 2+ bilaterally, without bruits. CV: RRR, no m/r/g.   LUNGS: CTA bilat, nonlabored resps, good aeration in all lung fields. ABD: soft, NT, ND, BS normal.  No hepatospenomegaly or mass.  No bruits. EXT: no clubbing, cyanosis, or edema.  Musculoskeletal: no joint swelling, erythema, warmth, or tenderness.  ROM of all joints intact. Skin - no sores or suspicious lesions or rashes or color changes   Pertinent labs:  Lab Results  Component Value Date   TSH 0.89 07/19/2017   Lab Results  Component Value Date   WBC 7.0 07/19/2017   HGB 13.3 07/19/2017   HCT 40.7 07/19/2017   MCV 92.7 07/19/2017   PLT 409.0 (H) 07/19/2017   Lab Results  Component Value Date   CREATININE 0.84 07/19/2017   BUN 19 07/19/2017   NA 141 07/19/2017   K 4.0 07/19/2017   CL 105 07/19/2017   CO2 27 07/19/2017   Lab Results  Component Value Date   ALT 29 07/19/2017   AST 17 07/19/2017   ALKPHOS 64 07/19/2017   BILITOT 0.5 07/19/2017   Lab Results  Component Value Date   CHOL 216 (H) 07/19/2017   Lab Results   Component Value Date   HDL 48.20 07/19/2017   Lab Results  Component Value Date   LDLCALC 142 (H) 07/19/2017   Lab Results  Component Value Date   TRIG 128.0 07/19/2017   Lab Results  Component Value Date   CHOLHDL 4 07/19/2017    ASSESSMENT AND PLAN:   Health maintenance exam: Reviewed age and gender appropriate health maintenance issues (prudent diet, regular exercise, health risks of tobacco and excessive alcohol, use of seatbelts, fire alarms in home, use of sunscreen).  Also reviewed age and gender appropriate health screening as well as vaccine recommendations. Congratulated her on excellent TLC and purposeful wt loss. Vaccines: flu vaccine->UTD.  Tdap->approx 2018 at her employer's health clinic.  Shingrix->she declines this. Labs: fasting HP labs Cervical ca screening: per GYN provider. Breast ca screening: screening mammogram scheduled for 07/2019 (per GYN provider). Colon ca screening:  Hx polyps->recall 07/2019.  Solitary thyroid nodule, longstanding stability, last endo f/u 07/2016.  She will arrange routine f/u with Dr. Dwyane Dee at her earliest convenience.  TSH today.  An After Visit Summary was printed and given to the patient.  FOLLOW UP:  Return in about 1 year (around 06/12/2020) for annual CPE (fasting).  Signed:  Crissie Sickles, MD           06/13/2019

## 2019-06-25 ENCOUNTER — Encounter: Payer: Self-pay | Admitting: Family Medicine

## 2019-06-25 ENCOUNTER — Ambulatory Visit (INDEPENDENT_AMBULATORY_CARE_PROVIDER_SITE_OTHER): Payer: No Typology Code available for payment source | Admitting: Family Medicine

## 2019-06-25 ENCOUNTER — Other Ambulatory Visit: Payer: Self-pay

## 2019-06-25 VITALS — BP 116/74 | HR 69 | Temp 98.1°F | Resp 16 | Ht 65.0 in | Wt 155.0 lb

## 2019-06-25 DIAGNOSIS — M79644 Pain in right finger(s): Secondary | ICD-10-CM

## 2019-06-25 DIAGNOSIS — S6991XA Unspecified injury of right wrist, hand and finger(s), initial encounter: Secondary | ICD-10-CM

## 2019-06-25 NOTE — Progress Notes (Signed)
OFFICE VISIT  06/25/2019   CC:  Chief Complaint  Patient presents with  . Swollen right pinky finger   HPI:    Patient is a 58 y.o. Caucasian female who presents for painful right pinky finger. Onset approx:  today, after her autistic son grabbed it and squeezed. It swelled a little.  Ibuprofen x 1,  Ice.  Some pain extending up extensor aspect of wrist and forearm on R.  Past Medical History:  Diagnosis Date  . Allergic rhinitis    seasonal  . Depression   . Melanoma ([place]) 08/2006   Righ thigh (Dr. [place] Crane); in situ--shave excision done, but [facility] did definitive excision: path showed residual melanoma in situ, margins free (11/01/17)  . Thyroid nodule 12/1998   Bx benign.Dr. Dwyane Dee following q1-2 yrs.  Pt has been euthyroid (was on synthroid at one point in time but this was d/c'd per pt).  F/u with endo 07/2016 showed stability so no further w/u recommended, just f/u with endo (Dr. Dwyane Dee) in 2 yrs.  . Varicose veins     Past Surgical History:  Procedure Laterality Date  . ANAL FISSURE REPAIR    . [place] and 2003  . COLONOSCOPY W/ POLYPECTOMY  08/07/2014   Sessile serrated polyp x 1.  Recall 5 yrs  . KNEE ARTHROSCOPY Left 1992  . thyroid nodule biopsy  2000   Benign    Outpatient Medications Prior to Visit  Medication Sig Dispense Refill  . 5-Hydroxytryptophan (5-HTP) 100 MG CAPS Take by mouth.    Marland Kitchen acetaminophen (TYLENOL) 325 MG tablet Take 650 mg by mouth every 6 (six) hours as needed.    . Ascorbic Acid (VITAMIN C GUMMIE PO) Take 250 mg by mouth 2 (two) times daily.    Marland Kitchen CETIRIZINE HCL PO Take 10 mg by mouth daily.    Marland Kitchen ELDERBERRY PO Take 50 mg by mouth daily.    Marland Kitchen ibuprofen (ADVIL,MOTRIN) 200 MG tablet Take 200 mg by mouth every 6 (six) hours as needed.    Marland Kitchen levonorgestrel (MIRENA) 20 MCG/24HR IUD 1 each by Intrauterine route once.    . Multiple Vitamins-Minerals (HAIR SKIN NAILS PO) Take by mouth 3 (three) times daily.    Marland Kitchen SM SUPER B  COMPLEX/C PO Take 1 tablet by mouth daily.    . fluticasone (FLONASE) 50 MCG/ACT nasal spray Place 2 sprays into both nostrils daily for 10 days. (Patient not taking: Reported on 06/25/2019) 16 g 0  . celecoxib (CELEBREX) 200 MG capsule Take 1 capsule (200 mg total) by mouth daily. (Patient not taking: Reported on 06/13/2019) 30 capsule 3   No facility-administered medications prior to visit.    Allergies  Allergen Reactions  . Meloxicam     Canker sores    ROS As per HPI  PE: Blood pressure 116/74, pulse 69, temperature 98.1 F (36.7 C), temperature source Temporal, resp. rate 16, height 5\' 5"  (1.651 m), weight 155 lb (70.3 kg), SpO2 98 %. Gen: Alert, well appearing.  Patient is oriented to person, place, time, and situation. AFFECT: pleasant, lucid thought and speech. R pinky finger with mild swelling, erythema, and TTP over DIP jt and distal phalanx, pain with flex/ext of DIP jt.  LABS:  No results found for: [facility]    Chemistry      Component Value Date/Time   NA 139 06/13/2019 1128   K 4.3 06/13/2019 1128   CL 105 06/13/2019 1128   CO2 25 06/13/2019 1128  BUN 16 06/13/2019 1128   CREATININE 0.77 06/13/2019 1128      Component Value Date/Time   CALCIUM 9.2 06/13/2019 1128   ALKPHOS 67 06/13/2019 1128   AST 20 06/13/2019 1128   ALT 31 06/13/2019 1128   BILITOT 0.5 06/13/2019 1128       IMPRESSION AND PLAN:  Right 5th finger pain; twist injury, suspect strain but need to r/o fx. Check finger x-ray today. Splinted and buddy taped in office today.   An After Visit Summary was printed and given to the patient.  FOLLOW UP: Return if symptoms worsen or fail to improve.  Signed:  Crissie Sickles, MD           06/29/2019

## 2019-06-26 ENCOUNTER — Ambulatory Visit (HOSPITAL_BASED_OUTPATIENT_CLINIC_OR_DEPARTMENT_OTHER)
Admission: RE | Admit: 2019-06-26 | Discharge: 2019-06-26 | Disposition: A | Discharge status: Home / Self Care | Payer: No Typology Code available for payment source | Source: Ambulatory Visit | Attending: Family Medicine | Admitting: Family Medicine

## 2019-06-26 DIAGNOSIS — M79644 Pain in right finger(s): Secondary | ICD-10-CM | POA: Insufficient documentation

## 2019-06-27 ENCOUNTER — Telehealth: Payer: Self-pay | Admitting: Family Medicine

## 2019-06-27 DIAGNOSIS — S62666A Nondisplaced fracture of distal phalanx of right little finger, initial encounter for closed fracture: Secondary | ICD-10-CM

## 2019-06-27 NOTE — Telephone Encounter (Signed)
Patient advised that referral placed and will be contacted for scheduling details.

## 2019-06-27 NOTE — Telephone Encounter (Signed)
Patient called checking on referral for her finger. She would like to go to [facility] and see Dr. Amedeo Plenty. Thank you

## 2019-06-27 NOTE — Telephone Encounter (Signed)
Patient called [facility] and Dr.Gramig will be out of the office today and all next week on vacation. She would like to see Dr.Ortmann and would like a referral placed.  Please advise, thanks.

## 2019-06-30 NOTE — Telephone Encounter (Signed)
Patient has been scheduled with Dr. Caralyn Guile 07/02/19.

## 2019-07-22 ENCOUNTER — Other Ambulatory Visit: Payer: Self-pay

## 2019-07-22 ENCOUNTER — Encounter (HOSPITAL_BASED_OUTPATIENT_CLINIC_OR_DEPARTMENT_OTHER): Payer: Self-pay

## 2019-07-22 ENCOUNTER — Ambulatory Visit (HOSPITAL_BASED_OUTPATIENT_CLINIC_OR_DEPARTMENT_OTHER)
Admission: RE | Admit: 2019-07-22 | Discharge: 2019-07-22 | Disposition: A | Discharge status: Home / Self Care | Payer: No Typology Code available for payment source | Source: Ambulatory Visit | Attending: Obstetrics and Gynecology | Admitting: Obstetrics and Gynecology

## 2019-07-22 DIAGNOSIS — Z1231 Encounter for screening mammogram for malignant neoplasm of breast: Secondary | ICD-10-CM | POA: Diagnosis present

## 2019-07-30 ENCOUNTER — Ambulatory Visit: Payer: No Typology Code available for payment source

## 2019-08-06 ENCOUNTER — Encounter: Payer: Self-pay | Admitting: Family Medicine

## 2019-08-17 NOTE — Progress Notes (Signed)
Patient ID: Tracy Moore, female   DOB: 1960-10-17, 59 y.o.   MRN: [id number]    Reason for Appointment: Left thyroid nodule    History of Present Illness:   The patient's thyroid nodule was first discovered in the year 2000 incidentally by her gynecologist Initially the nodule was 14 mm in size. Biopsy was done soon after diagnosis and showed mostly colloid with a few benign follicular cells Also previously had negative thyroid antibodies  On exam the nodule measured about 2 cm in 2003 She was given a trial of thyroid suppression for sometime with levothyroxine but this caused relatively low TSH and was stopped  She was last seen in 2018  Recent history: She thinks her left thyroid nodule is more easily visible although she has lost up to 15 pounds She has no local discomfort, pressure sensation No difficulty swallowing  She has not had an ultrasound since her initial diagnosis in 2000   Lab Results  Component Value Date   TSH 0.50 06/13/2019   No visits with results within 1 Week(s) from this visit.  Latest known visit with results is:  Office Visit on 06/13/2019  Component Date Value Ref Range Status  . WBC 06/13/2019 4.3  4.0 - 10.5 K/uL Final  . RBC 06/13/2019 4.37  3.87 - 5.11 Mil/uL Final  . Hemoglobin 06/13/2019 13.2  12.0 - 15.0 g/dL Final  . HCT 06/13/2019 40.0  36.0 - 46.0 % Final  . MCV 06/13/2019 91.6  78.0 - 100.0 fl Final  . MCHC 06/13/2019 32.9  30.0 - 36.0 g/dL Final  . RDW 06/13/2019 13.7  11.5 - 15.5 % Final  . Platelets 06/13/2019 264.0  150.0 - 400.0 K/uL Final  . Neutrophils Relative % 06/13/2019 55.3  43.0 - 77.0 % Final  . Lymphocytes Relative 06/13/2019 34.4  12.0 - 46.0 % Final  . Monocytes Relative 06/13/2019 8.4  3.0 - 12.0 % Final  . Eosinophils Relative 06/13/2019 1.2  0.0 - 5.0 % Final  . Basophils Relative 06/13/2019 0.7  0.0 - 3.0 % Final  . Neutro Abs 06/13/2019 2.4  1.4 - 7.7 K/uL Final  . Lymphs Abs 06/13/2019 1.5  0.7 - 4.0 K/uL  Final  . Monocytes Absolute 06/13/2019 0.4  0.1 - 1.0 K/uL Final  . Eosinophils Absolute 06/13/2019 0.0  0.0 - 0.7 K/uL Final  . Basophils Absolute 06/13/2019 0.0  0.0 - 0.1 K/uL Final  . TSH 06/13/2019 0.50  0.35 - 4.50 uIU/mL Final  . Cholesterol 06/13/2019 161  0 - 200 mg/dL Final   ATP III Classification       Desirable:  < 200 mg/dL               Borderline High:  200 - 239 mg/dL          High:  > = 240 mg/dL  . Triglycerides 06/13/2019 55.0  0.0 - 149.0 mg/dL Final   Normal:  <150 mg/dLBorderline High:  150 - 199 mg/dL  . HDL 06/13/2019 46.50  >39.00 mg/dL Final  . VLDL 06/13/2019 11.0  0.0 - 40.0 mg/dL Final  . LDL Cholesterol 06/13/2019 103* 0 - 99 mg/dL Final  . Total CHOL/HDL Ratio 06/13/2019 3   Final                  Men          Women1/2 Average Risk     3.4          3.3Average Risk  5.0          4.42X Average Risk          9.6          7.13X Average Risk          15.0          11.0                      . NonHDL 06/13/2019 114.45   Final   NOTE:  Non-HDL goal should be 30 mg/dL higher than patient's LDL goal (i.e. LDL goal of < 70 mg/dL, would have non-HDL goal of < 100 mg/dL)  . Sodium 06/13/2019 139  135 - 145 mEq/L Final  . Potassium 06/13/2019 4.3  3.5 - 5.1 mEq/L Final  . Chloride 06/13/2019 105  96 - 112 mEq/L Final  . CO2 06/13/2019 25  19 - 32 mEq/L Final  . Glucose, Bld 06/13/2019 78  70 - 99 mg/dL Final  . BUN 06/13/2019 16  6 - 23 mg/dL Final  . Creatinine, Ser 06/13/2019 0.77  0.40 - 1.20 mg/dL Final  . Total Bilirubin 06/13/2019 0.5  0.2 - 1.2 mg/dL Final  . Alkaline Phosphatase 06/13/2019 67  39 - 117 U/L Final  . AST 06/13/2019 20  0 - 37 U/L Final  . ALT 06/13/2019 31  0 - 35 U/L Final  . Total Protein 06/13/2019 6.9  6.0 - 8.3 g/dL Final  . Albumin 06/13/2019 4.3  3.5 - 5.2 g/dL Final  . GFR 06/13/2019 77.34  >60.00 mL/min Final  . Calcium 06/13/2019 9.2  8.4 - 10.5 mg/dL Final     Allergies as of 08/18/2019      Reactions   Meloxicam    Canker  sores      Medication List       Accurate as of August 18, 2019  1:36 PM. If you have any questions, ask your nurse or doctor.        5-HTP 100 MG Caps Take by mouth.   acetaminophen 325 MG tablet Commonly known as: TYLENOL Take 650 mg by mouth every 6 (six) hours as needed.   CETIRIZINE HCL PO Take 10 mg by mouth daily.   ELDERBERRY PO Take 50 mg by mouth daily.   fluticasone 50 MCG/ACT nasal spray Commonly known as: FLONASE Place 2 sprays into both nostrils daily for 10 days.   HAIR SKIN NAILS PO Take by mouth daily.   ibuprofen 200 MG tablet Commonly known as: ADVIL Take 200 mg by mouth every 6 (six) hours as needed.   levonorgestrel 20 MCG/24HR IUD Commonly known as: MIRENA 1 each by Intrauterine route once.   SM SUPER B COMPLEX/C PO Take 1 tablet by mouth daily.   VITAMIN C GUMMIE PO Take 250 mg by mouth 2 (two) times daily.       Allergies:  Allergies  Allergen Reactions  . Meloxicam     Canker sores    Past Medical History:  Diagnosis Date  . Allergic rhinitis    seasonal  . Depression   . 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 Melanoma ([place]) 08/2006   [place]igh thigh (Dr. Nevada Crane); in situ--shave excision done, but [facility] did definitive excision: path showed residual melanoma in situ, margins free (11/01/17)  . Thyroid nodule 12/1998   Bx benign.Dr. Dwyane Dee following q1-2 yrs.  Pt has been euthyroid (was on synthroid at one point in time  but this was d/c'd per pt).  F/u with endo 07/2016 showed stability so no further w/u recommended, just f/u with endo (Dr. Dwyane Dee) in 2 yrs.  . Varicose veins     Past Surgical History:  Procedure Laterality Date  . ANAL FISSURE REPAIR    . [place] and 2003  . COLONOSCOPY W/ POLYPECTOMY  08/07/2014   Sessile serrated polyp x 1.  Recall 5 yrs  . KNEE ARTHROSCOPY Left 1992  . thyroid nodule biopsy  2000   Benign    Family History  Problem Relation Age of  Onset  . Arthritis Mother   . Hypertension Father   . Arthritis Father   . Heart disease Father   . Thyroid disease Father   . Autism Son   . Dementia Maternal Grandmother   . Cancer Maternal Grandfather        prostate  . Cancer Paternal Grandfather        pancreatic cancer  . Colon cancer Paternal Grandfather 33  . Breast cancer Maternal Aunt     Social History:  reports that she has never smoked. She has never used smokeless tobacco. She reports current alcohol use. She reports that she does not use drugs.   Review of Systems:  She has been on a special diet with some protein supplements at breakfast and lunch and eating only vegetables and lean meat in the evenings With this she has lost weight progressively She says she feels better overall         Examination:   BP 102/60 (BP Location: Left Arm, Patient Position: Sitting, Cuff Size: Normal)   Pulse 72   Ht 5\' 5"  (1.651 m)   Wt 151 lb (68.5 kg)   SpO2 96%   BMI 25.13 kg/m      She has a firm 3-3.5 cm left thyroid lobe enlargement which is very firm, more prominent medially  Right side is not palpable.  No lymphadenopathy in the neck     Assessment/Plan:  Long-standing solitary left-sided nodule previously benign on biopsy at time of diagnosis in the year 2000 Probably had a colloid nodule in the past  Although she has lost some weight since her last visit her thyroid nodule is appearing larger and firmer than before TSH normal as before  Will need to redo an ultrasound to evaluate the characteristics of the nodule She agrees to do this and will discuss results when available  Also discussed benefits and safety of Covid vaccine, currently patient is refusing to take this for concerns about side effects and does not appear to be understanding the benefit.    Elayne Snare 08/18/2019

## 2019-08-18 ENCOUNTER — Encounter: Payer: Self-pay | Admitting: Endocrinology

## 2019-08-18 ENCOUNTER — Ambulatory Visit: Payer: No Typology Code available for payment source | Admitting: Endocrinology

## 2019-08-18 ENCOUNTER — Other Ambulatory Visit: Payer: Self-pay

## 2019-08-18 VITALS — BP 102/60 | HR 72 | Ht 65.0 in | Wt 151.0 lb

## 2019-08-18 DIAGNOSIS — E041 Nontoxic single thyroid nodule: Secondary | ICD-10-CM

## 2019-09-03 ENCOUNTER — Ambulatory Visit
Admission: RE | Admit: 2019-09-03 | Discharge: 2019-09-03 | Disposition: A | Discharge status: Home / Self Care | Payer: No Typology Code available for payment source | Source: Ambulatory Visit | Attending: Endocrinology | Admitting: Endocrinology

## 2019-09-03 DIAGNOSIS — E041 Nontoxic single thyroid nodule: Secondary | ICD-10-CM

## 2019-09-04 ENCOUNTER — Telehealth: Payer: Self-pay | Admitting: Endocrinology

## 2019-09-04 NOTE — Telephone Encounter (Signed)
Patient called stating that she was retuning call regarding lab results.  Call back number is [phone]

## 2019-09-04 NOTE — Telephone Encounter (Signed)
Pt was given lab results. See lab notes for documentation.

## 2019-09-06 ENCOUNTER — Other Ambulatory Visit: Payer: Self-pay | Admitting: Endocrinology

## 2019-09-06 DIAGNOSIS — E041 Nontoxic single thyroid nodule: Secondary | ICD-10-CM

## 2019-09-11 ENCOUNTER — Ambulatory Visit
Admission: RE | Admit: 2019-09-11 | Discharge: 2019-09-11 | Disposition: A | Discharge status: Home / Self Care | Payer: No Typology Code available for payment source | Source: Ambulatory Visit | Attending: Endocrinology | Admitting: Endocrinology

## 2019-09-11 ENCOUNTER — Other Ambulatory Visit (HOSPITAL_COMMUNITY)
Admission: RE | Admit: 2019-09-11 | Discharge: 2019-09-11 | Disposition: A | Discharge status: Home / Self Care | Payer: No Typology Code available for payment source | Source: Ambulatory Visit | Attending: Endocrinology | Admitting: Endocrinology

## 2019-09-11 DIAGNOSIS — E041 Nontoxic single thyroid nodule: Secondary | ICD-10-CM | POA: Insufficient documentation

## 2019-09-12 LAB — CYTOLOGY - NON PAP

## 2019-10-03 ENCOUNTER — Encounter (HOSPITAL_COMMUNITY): Payer: Self-pay

## 2019-10-06 NOTE — Progress Notes (Signed)
Please call to let patient know that the final biopsy results are benign and no further action needed

## 2020-01-02 ENCOUNTER — Telehealth (INDEPENDENT_AMBULATORY_CARE_PROVIDER_SITE_OTHER): Payer: No Typology Code available for payment source | Admitting: Medical

## 2020-01-02 ENCOUNTER — Telehealth: Payer: Self-pay

## 2020-01-02 ENCOUNTER — Other Ambulatory Visit: Payer: Self-pay

## 2020-01-02 VITALS — HR 88

## 2020-01-02 DIAGNOSIS — R0981 Nasal congestion: Secondary | ICD-10-CM

## 2020-01-02 DIAGNOSIS — R059 Cough, unspecified: Secondary | ICD-10-CM

## 2020-01-02 DIAGNOSIS — J3489 Other specified disorders of nose and nasal sinuses: Secondary | ICD-10-CM | POA: Diagnosis not present

## 2020-01-02 DIAGNOSIS — R05 Cough: Secondary | ICD-10-CM

## 2020-01-02 MED ORDER — FLUTICASONE PROPIONATE 50 MCG/ACT NA SUSP
2.0000 | Freq: Every day | NASAL | 1 refills | Status: DC
Start: 2020-01-02 — End: 2020-08-05

## 2020-01-02 MED ORDER — AMOXICILLIN-POT CLAVULANATE 875-125 MG PO TABS: 1.0000 | ORAL_TABLET | Freq: Two times a day (BID) | ORAL | 0 refills | Status: DC

## 2020-01-02 MED ORDER — BENZONATATE 100 MG PO CAPS: 100.0000 mg | ORAL_CAPSULE | Freq: Three times a day (TID) | ORAL | 0 refills | Status: DC | PRN

## 2020-01-02 MED FILL — AMOX-CLAV 875-125 MG TABLET: 875-125 | 10 days supply | Qty: 20 | Fill #0

## 2020-01-02 MED FILL — BENZONATATE 100 MG CAPS: 100 | 10 days supply | Qty: 30 | Fill #0

## 2020-01-02 MED FILL — FLUTICASONE PROP 50 MCG SPR: 50 | 30 days supply | Qty: 16 | Fill #0

## 2020-01-02 NOTE — Telephone Encounter (Signed)
Patient contacted and scheduled with [facility] provider for possible sinus infection.

## 2020-01-02 NOTE — Patient Instructions (Signed)
Recent sinus pressure nasal congestion.  Probable sinusitis with allergy component.  I will prescribe Augmentin antibiotic, Flonase nasal spray and benzonatate if needed for cough.  Follow-up in 7 to 10 days or as needed.

## 2020-01-02 NOTE — Telephone Encounter (Signed)
Patient called in stating she thinks she has a sinus infection. She has cough, runny nose, no fever nasal drainage. She was tested for covid on  12/31/19 and was negative she is wanting to know what she can do or take to help this get better     Please call and advise

## 2020-01-02 NOTE — Progress Notes (Signed)
   Subjective:    Patient ID: Tracy Moore, female    DOB: 1960/11/30, 59 y.o.   MRN: [id number]  HPI Virtual Visit via Video Note  I connected with Tracy Moore on 01/02/20 at 11:20 AM EDT by a video enabled telemedicine application and verified that I am speaking with the correct person using two identifiers.  Location: Patient: home Provider: office   Pt did not check vitals. No hx of htn.   I discussed the limitations of evaluation and management by telemedicine and the availability of in person appointments. The patient expressed understanding and agreed to proceed.  History of Present Illness: Since Tuesday morning nasal congestion, runny nose and sinus pressure. Pt tried sudafed and congestion less but got some productive cough and sinus pressure now moderate..   Pt states covid tested and was negative.  No fever, no chills or sweats.     Observations/Objective:(brief video visit before found out audio did not work) General-no acute distress, pleasant, oriented. Lungs- on inspection lungs appear unlabored. Neck- no tracheal deviation or jvd on inspection. Neuro- gross motor function appears intact. heent- forehead sinus area pain on self palpation) some teeth disomfort upper teeth early in week.  Assessment and Plan: Recent sinus pressure nasal congestion.  Probable sinusitis with allergy component.  I will prescribe Augmentin antibiotic, Flonase nasal spray and benzonatate if needed for cough.  Follow-up in 7 to 10 days or as needed.  Mackie Pai, PA-C   Time spent with patient today was 20  minutes which consisted of  discussing diagnosis,  treatment and documentation.  Follow Up Instructions:    I discussed the assessment and treatment plan with the patient. The patient was provided an opportunity to ask questions and all were answered. The patient agreed with the plan and demonstrated an understanding of the instructions.   The patient was advised  to call back or seek an in-person evaluation if the symptoms worsen or if the condition fails to improve as anticipated.     Mackie Pai, PA-C    Review of Systems  HENT: Positive for congestion, sinus pressure and sinus pain.        Sone ear pressure as well.  Respiratory: Positive for cough. Negative for chest tightness and shortness of breath.   Cardiovascular: Negative for chest pain and palpitations.  Gastrointestinal: Negative for abdominal pain.  Musculoskeletal: Negative for back pain and myalgias.  Neurological: Negative for dizziness and headaches.  Hematological: Negative for adenopathy. Does not bruise/bleed easily.  Psychiatric/Behavioral: Negative for behavioral problems and confusion.       Objective:   Physical Exam        Assessment & Plan:

## 2020-02-04 ENCOUNTER — Telehealth: Payer: Self-pay | Admitting: Family Medicine

## 2020-02-04 NOTE — Telephone Encounter (Signed)
Caller : Cahtrine  Call Back # [phone]  Patient states she saw Grayce Sessions for a virtual visit on June 25,2021. Patient states she has received EOB from insurance stating that she had a office visit, patient charged $ 40.00 co pay, With her insurance plan ([employer]). Per patient Telehealth visit are $ 0.00 copayment.  Please Advise

## 2020-02-06 NOTE — Telephone Encounter (Signed)
HI Anderson Malta,  If they follow [facility] guidance:  Effective Jan. 1, 2021, [employer] updated and expanded our Telehealth Reimbursement  so that most [employer] benefits plans include telehealth visits with [employer]. Members will be responsible for any copay, coinsurance or deductible, according to their benefit plan.time passed  I think if it is covid related then copays could be waived.    Hope that helps.  Dawn

## 2020-02-17 NOTE — Telephone Encounter (Signed)
Left message for patient on her voice mail letting her know I spoke with Nevin Bloodgood at [facility] today and she will be sending this claim back through for reprocessing.  The $40.00 co-pay was billed incorrectly to patient.

## 2020-03-12 ENCOUNTER — Telehealth: Payer: Self-pay

## 2020-03-12 NOTE — Telephone Encounter (Signed)
Patient called today and stated her insurance is requesting a letter stating the FNA  she had on March 4th was medically necessary or she will have to pay $6000-letter would need to be faxed to [facility] and patient would like a call when this has been done

## 2020-03-16 ENCOUNTER — Encounter: Payer: Self-pay | Admitting: Endocrinology

## 2020-03-16 NOTE — Telephone Encounter (Signed)
Letter has been printed and ready to mail or fax

## 2020-03-16 NOTE — Telephone Encounter (Signed)
Before we send a letter out please ask her if she has problem with the coverage of the cytology or the [employer] molecular testing.  If it is the latter she will need to talk to the customer service at the [place] at [phone]

## 2020-03-16 NOTE — Telephone Encounter (Signed)
Spoke with patient . She would like letter mailed to her home. Mailed today.

## 2020-03-16 NOTE — Telephone Encounter (Signed)
Please advise 

## 2020-04-16 ENCOUNTER — Encounter: Payer: Self-pay | Admitting: Internal Medicine

## 2020-04-16 ENCOUNTER — Other Ambulatory Visit: Payer: Self-pay

## 2020-04-16 ENCOUNTER — Telehealth: Payer: Self-pay

## 2020-04-16 DIAGNOSIS — Z1211 Encounter for screening for malignant neoplasm of colon: Secondary | ICD-10-CM

## 2020-04-16 NOTE — Telephone Encounter (Signed)
Yes, pls order referral

## 2020-04-16 NOTE — Telephone Encounter (Signed)
Patient needs a referral for her upcoming colonoscopy. The appointment is already scheduled. She just needs the referral for [employer].

## 2020-04-16 NOTE — Telephone Encounter (Signed)
Referral completed, patient was made aware.

## 2020-04-16 NOTE — Telephone Encounter (Signed)
Pt was last seen 06/2019 for CPE and advised to f/u 1 year. Okay for referral?

## 2020-04-23 ENCOUNTER — Telehealth: Payer: Self-pay

## 2020-04-23 DIAGNOSIS — Z8669 Personal history of other diseases of the nervous system and sense organs: Secondary | ICD-10-CM

## 2020-04-23 DIAGNOSIS — Z85820 Personal history of malignant melanoma of skin: Secondary | ICD-10-CM

## 2020-04-23 NOTE — Telephone Encounter (Signed)
Both referrals completed back in 2019. PT referral was for sciatica, right side and dermatology for previous melanoma.   Please advise, thanks.

## 2020-04-23 NOTE — Telephone Encounter (Signed)
Pt called requesting PCP to put in two referrals for her.  Pt has the [employer] for insurance. She needs one to [facility] for a yearly appt with Dr. Ronnald Ramp.  That appt is already scheduled for 05/18/2020 @ 9:20 am per pt.  The other referral she needs is for [place], in [facility] with Dr. Karie Schwalbe for PRN appointments.

## 2020-04-25 NOTE — Telephone Encounter (Signed)
OK both referrals ordered

## 2020-04-26 NOTE — Telephone Encounter (Signed)
Pt aware of referrals

## 2020-06-09 ENCOUNTER — Ambulatory Visit (AMBULATORY_SURGERY_CENTER): Payer: Self-pay | Admitting: *Deleted

## 2020-06-09 ENCOUNTER — Other Ambulatory Visit: Payer: Self-pay | Admitting: Internal Medicine

## 2020-06-09 ENCOUNTER — Other Ambulatory Visit: Payer: Self-pay

## 2020-06-09 VITALS — Ht 66.0 in | Wt 148.0 lb

## 2020-06-09 DIAGNOSIS — Z86010 Personal history of colonic polyps: Secondary | ICD-10-CM

## 2020-06-09 MED ORDER — SUPREP BOWEL PREP KIT 17.5-3.13-1.6 GM/177ML PO SOLN: 1.0000 | Freq: Once | ORAL | 0 refills | Status: DC

## 2020-06-09 MED ORDER — SUPREP BOWEL PREP KIT 17.5-3.13-1.6 GM/177ML PO SOLN
1.0000 | Freq: Once | ORAL | 0 refills | Status: DC
Start: 2020-06-09 — End: 2020-06-09

## 2020-06-09 MED FILL — SUPREP BOWEL PREP KIT: 17.5-3.13-1 | 2 days supply | Qty: 354 | Fill #0

## 2020-06-09 NOTE — Progress Notes (Signed)
No egg or soy allergy known to patient  No issues with past sedation with any surgeries or procedures no intubation problems in the past  No FH of Malignant Hyperthermia No diet pills per patient No home 02 use per patient  No blood thinners per patient  Pt denies issues with constipation  No A fib or A flutter  EMMI video to pt or via [place] 19 guidelines implemented in PV today with Pt and RN   Fully vax'd   Suprep Coupon to pt and Pharmacy   Due to the COVID-19 pandemic we are asking patients to follow these guidelines. Please only bring one care partner. Please be aware that your care partner may wait in the car in the parking lot or if they feel like they will be too hot to wait in the car, they may wait in the lobby on the 4th floor. All care partners are required to wear a mask the entire time (we do not have any that we can provide them), they need to practice social distancing, and we will do a Covid check for all patient's and care partners when you arrive. Also we will check their temperature and your temperature. If the care partner waits in their car they need to stay in the parking lot the entire time and we will call them on their cell phone when the patient is ready for discharge so they can bring the car to the front of the building. Also all patient's will need to wear a mask into building.

## 2020-06-16 ENCOUNTER — Encounter: Payer: Self-pay | Admitting: Internal Medicine

## 2020-06-25 ENCOUNTER — Ambulatory Visit (AMBULATORY_SURGERY_CENTER): Payer: No Typology Code available for payment source | Admitting: Internal Medicine

## 2020-06-25 ENCOUNTER — Encounter: Payer: Self-pay | Admitting: Internal Medicine

## 2020-06-25 ENCOUNTER — Other Ambulatory Visit: Payer: Self-pay

## 2020-06-25 VITALS — BP 111/66 | HR 66 | Temp 96.9°F | Resp 14 | Ht 66.0 in | Wt 148.0 lb

## 2020-06-25 DIAGNOSIS — K635 Polyp of colon: Secondary | ICD-10-CM | POA: Diagnosis not present

## 2020-06-25 DIAGNOSIS — D120 Benign neoplasm of cecum: Secondary | ICD-10-CM

## 2020-06-25 DIAGNOSIS — D123 Benign neoplasm of transverse colon: Secondary | ICD-10-CM

## 2020-06-25 DIAGNOSIS — Z86010 Personal history of colonic polyps: Secondary | ICD-10-CM

## 2020-06-25 MED ORDER — SODIUM CHLORIDE 0.9 % IV SOLN: 500.0000 mL | Freq: Once | INTRAVENOUS | Status: DC

## 2020-06-25 NOTE — Progress Notes (Signed)
PT taken to PACU. Monitors in place. VSS. Report given to RN. 

## 2020-06-25 NOTE — Progress Notes (Signed)
Called to room to assist during endoscopic procedure.  Patient ID and intended procedure confirmed with present staff. Received instructions for my participation in the procedure from the performing physician.  

## 2020-06-25 NOTE — Progress Notes (Signed)
Medical history reviewed with no changes noted. VS assessed by C.W 

## 2020-06-25 NOTE — Op Note (Signed)
[facility] Patient Name: Tracy Moore Procedure Date: 06/25/2020 9:04 AM MRN: [id number] Endoscopist: Jerene Bears , MD Age: 59 Referring MD:  Date of Birth: 10-16-1960 Gender: Female Account #: [id number] Procedure:                Colonoscopy Indications:              High risk colon cancer surveillance: Personal                            history of sessile serrated colon polyp (less than                            10 mm in size) with no dysplasia, Last colonoscopy:                            January 2016 Medicines:                Monitored Anesthesia Care Procedure:                Pre-Anesthesia Assessment:                           - Prior to the procedure, a History and Physical                            was performed, and patient medications and                            allergies were reviewed. The patient's tolerance of                            previous anesthesia was also reviewed. The risks                            and benefits of the procedure and the sedation                            options and risks were discussed with the patient.                            All questions were answered, and informed consent                            was obtained. Prior Anticoagulants: The patient has                            taken no previous anticoagulant or antiplatelet                            agents. ASA Grade Assessment: II - A patient with                            mild systemic disease. After reviewing the risks  and benefits, the patient was deemed in                            satisfactory condition to undergo the procedure.                           After obtaining informed consent, the colonoscope                            was passed under direct vision. Throughout the                            procedure, the patient's blood pressure, pulse, and                            oxygen saturations were monitored continuously.  The                            Olympus PFC-H190DL [id number]) Colonoscope was                            introduced through the anus and advanced to the                            cecum, identified by appendiceal orifice and                            ileocecal valve. The colonoscopy was performed                            without difficulty. The patient tolerated the                            procedure well. The quality of the bowel                            preparation was good. The ileocecal valve,                            appendiceal orifice, and rectum were photographed. Scope In: 9:16:00 AM Scope Out: 9:31:04 AM Scope Withdrawal Time: 0 hours 11 minutes 17 seconds  Total Procedure Duration: 0 hours 15 minutes 4 seconds  Findings:                 The digital rectal exam was normal.                           Two sessile polyps were found in the proximal                            transverse colon and cecum. The polyps were 2 to 3                            mm in size. These polyps were removed with a cold  biopsy forceps. Resection and retrieval were                            complete.                           Internal hemorrhoids were found during                            retroflexion. The hemorrhoids were small.                           The exam was otherwise without abnormality. Complications:            No immediate complications. Estimated Blood Loss:     Estimated blood loss was minimal. Impression:               - Two 2 to 3 mm polyps in the proximal transverse                            colon and in the cecum, removed with a cold biopsy                            forceps. Resected and retrieved.                           - Small internal hemorrhoids.                           - The examination was otherwise normal. Recommendation:           - Patient has a contact number available for                            emergencies. The signs and  symptoms of potential                            delayed complications were discussed with the                            patient. Return to normal activities tomorrow.                            Written discharge instructions were provided to the                            patient.                           - Resume previous diet.                           - Continue present medications.                           - Await pathology results.                           -  Repeat colonoscopy is recommended for                            surveillance. The colonoscopy date will be                            determined after pathology results from today's                            exam become available for review. Jerene Bears, MD 06/25/2020 9:33:53 AM This report has been signed electronically.

## 2020-06-25 NOTE — Patient Instructions (Signed)
Information on polyps and hemorrhoids given to you today.  Await pathology results.  Resume previous diet and medications.   YOU HAD AN ENDOSCOPIC PROCEDURE TODAY AT THE [facility]:   Refer to the procedure report that was given to you for any specific questions about what was found during the examination.  If the procedure report does not answer your questions, please call your gastroenterologist to clarify.  If you requested that your care partner not be given the details of your procedure findings, then the procedure report has been included in a sealed envelope for you to review at your convenience later.  YOU SHOULD EXPECT: Some feelings of bloating in the abdomen. Passage of more gas than usual.  Walking can help get rid of the air that was put into your GI tract during the procedure and reduce the bloating. If you had a lower endoscopy (such as a colonoscopy or flexible sigmoidoscopy) you may notice spotting of blood in your stool or on the toilet paper. If you underwent a bowel prep for your procedure, you may not have a normal bowel movement for a few days.  Please Note:  You might notice some irritation and congestion in your nose or some drainage.  This is from the oxygen used during your procedure.  There is no need for concern and it should clear up in a day or so.  SYMPTOMS TO REPORT IMMEDIATELY:  Following lower endoscopy (colonoscopy or flexible sigmoidoscopy):  Excessive amounts of blood in the stool  Significant tenderness or worsening of abdominal pains  Swelling of the abdomen that is new, acute  Fever of 100F or higher   For urgent or emergent issues, a gastroenterologist can be reached at any hour by calling [phone]. Do not use MyChart messaging for urgent concerns.    DIET:  We do recommend a small meal at first, but then you may proceed to your regular diet.  Drink plenty of fluids but you should avoid alcoholic beverages for 24  hours.  ACTIVITY:  You should plan to take it easy for the rest of today and you should NOT DRIVE or use heavy machinery until tomorrow (because of the sedation medicines used during the test).    FOLLOW UP: Our staff will call the number listed on your records 48-72 hours following your procedure to check on you and address any questions or concerns that you may have regarding the information given to you following your procedure. If we do not reach you, we will leave a message.  We will attempt to reach you two times.  During this call, we will ask if you have developed any symptoms of COVID 19. If you develop any symptoms (ie: fever, flu-like symptoms, shortness of breath, cough etc.) before then, please call [phone].  If you test positive for Covid 19 in the 2 weeks post procedure, please call and report this information to us.    If any biopsies were taken you will be contacted by phone or by letter within the next 1-3 weeks.  Please call us at [phone] if you have not heard about the biopsies in 3 weeks.    SIGNATURES/CONFIDENTIALITY: You and/or your care partner have signed paperwork which will be entered into your electronic medical record.  These signatures attest to the fact that that the information above on your After Visit Summary has been reviewed and is understood.  Full responsibility of the confidentiality of this discharge information lies with you and/or   your care-partner. 

## 2020-06-29 ENCOUNTER — Telehealth: Payer: Self-pay | Admitting: *Deleted

## 2020-06-29 NOTE — Telephone Encounter (Signed)
  Follow up Call-  Call back number 06/25/2020  Post procedure Call Back phone  # [phone]  Permission to leave phone message Yes  Some recent data might be hidden     Patient questions:  Do you have a fever, pain , or abdominal swelling? No. Pain Score  0 *  Have you tolerated food without any problems? Yes.    Have you been able to return to your normal activities? Yes.    Do you have any questions about your discharge instructions: Diet   No. Medications  No. Follow up visit  No.  Do you have questions or concerns about your Care? No.  Actions: * If pain score is 4 or above: 1. No action needed, pain <4.Have you developed a fever since your procedure? no  2.   Have you had an respiratory symptoms (SOB or cough) since your procedure? no  3.   Have you tested positive for COVID 19 since your procedure no  4.   Have you had any family members/close contacts diagnosed with the COVID 19 since your procedure?  no   If yes to any of these questions please route to Joylene John, RN and Joella Prince, RN

## 2020-07-05 ENCOUNTER — Encounter: Payer: Self-pay | Admitting: Internal Medicine

## 2020-07-19 ENCOUNTER — Encounter: Payer: No Typology Code available for payment source | Admitting: Family Medicine

## 2020-07-21 ENCOUNTER — Encounter (HOSPITAL_BASED_OUTPATIENT_CLINIC_OR_DEPARTMENT_OTHER): Payer: Self-pay

## 2020-07-21 ENCOUNTER — Encounter (HOSPITAL_BASED_OUTPATIENT_CLINIC_OR_DEPARTMENT_OTHER): Payer: No Typology Code available for payment source

## 2020-07-21 DIAGNOSIS — Z1231 Encounter for screening mammogram for malignant neoplasm of breast: Secondary | ICD-10-CM

## 2020-08-04 ENCOUNTER — Other Ambulatory Visit: Payer: Self-pay

## 2020-08-04 ENCOUNTER — Other Ambulatory Visit (HOSPITAL_BASED_OUTPATIENT_CLINIC_OR_DEPARTMENT_OTHER): Payer: Self-pay | Admitting: Family Medicine

## 2020-08-04 ENCOUNTER — Encounter: Payer: Self-pay | Admitting: Family Medicine

## 2020-08-04 DIAGNOSIS — Z1231 Encounter for screening mammogram for malignant neoplasm of breast: Secondary | ICD-10-CM

## 2020-08-04 NOTE — Progress Notes (Unsigned)
Office Note 08/05/2020  CC:  Chief Complaint  Patient presents with  . Annual Exam   HPI:  Tracy Moore is a 60 y.o. White female who is here for annual health maintenance exam  Past Medical History:  Diagnosis Date  . Allergic rhinitis    seasonal  . History of adenomatous polyp of colon    recall 06/2025  . History of depression   . 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 Nevada Crane); in situ--shave excision done, but [facility] did definitive excision: path showed residual melanoma in situ, margins free (11/01/17)  . Thyroid nodule 12/1998   L side: Bx benign.Dr. Dwyane Dee following q1-2 yrs.  Pt has been euthyroid (was on synthroid at one point in time but this was d/c'd per pt).  F/u with endo 07/2016 showed stability.  Rpt u/s and FNA benign 09/2019 (Dr. Dwyane Dee).  . Varicose veins     Past Surgical History:  Procedure Laterality Date  . ANAL FISSURE REPAIR    . [place] and 2003  . COLONOSCOPY W/ POLYPECTOMY  08/07/2014; 06/25/20   Recall 06/2025.  Marland Kitchen KNEE ARTHROSCOPY Left 1992  . POLYPECTOMY    . thyroid nodule biopsy  2000   Benign    Family History  Problem Relation Age of Onset  . Arthritis Mother   . Hypertension Father   . Arthritis Father   . Heart disease Father   . Thyroid disease Father   . Autism Son   . Dementia Maternal Grandmother   . Cancer Maternal Grandfather        prostate  . Cancer Paternal Grandfather        pancreatic cancer  . Colon cancer Paternal Grandfather 24  . Breast cancer Maternal Aunt   . Colon polyps Neg Hx   . Esophageal cancer Neg Hx   . Stomach cancer Neg Hx   . Rectal cancer Neg Hx     Social History   Socioeconomic History  . Marital status: Married    Spouse name: Not on file  . Number of children: Not on file  . Years of education: Not on file  . Highest education level: Not on file  Occupational History  . Not on file   Tobacco Use  . Smoking status: Never Smoker  . Smokeless tobacco: Never Used  Vaping Use  . Vaping Use: Never used  Substance and Sexual Activity  . Alcohol use: Yes    Alcohol/week: 0.0 standard drinks    Comment: socially  . Drug use: No  . Sexual activity: Not on file  Other Topics Concern  . Not on file  Social History Narrative   Married, daughter 25 y/o , son 22 y/o (has autism).   Lives in [place].   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  No drugs.   Social Determinants of Health   Financial Resource Strain: Not on file  Food Insecurity: Not on file  Transportation Needs: Not on file  Physical Activity: Not on file  Stress: Not on file  Social Connections: Not on file  Intimate Partner Violence: Not on file    Outpatient Medications Prior to Visit  Medication Sig Dispense Refill  . 5-Hydroxytryptophan (5-HTP) 100 MG CAPS Take by mouth.    Marland Kitchen acetaminophen (TYLENOL) 325 MG tablet Take 650 mg by mouth every 6 (six) hours as  needed.    . Calcium Carb-Cholecalciferol (CALCIUM 500/D PO) Take by mouth.    . CETIRIZINE HCL PO Take 10 mg by mouth daily.    Marland Kitchen ibuprofen (ADVIL,MOTRIN) 200 MG tablet Take 200 mg by mouth every 6 (six) hours as needed.    Marland Kitchen levonorgestrel (MIRENA) 20 MCG/24HR IUD 1 each by Intrauterine route once.    . Multiple Vitamins-Minerals (HAIR SKIN NAILS PO) Take by mouth daily.     Marland Kitchen SM SUPER B COMPLEX/C PO Take 1 tablet by mouth daily.    . Ascorbic Acid (VITAMIN C GUMMIE PO) Take 250 mg by mouth 2 (two) times daily. (Patient not taking: Reported on 08/05/2020)    . ELDERBERRY PO Take 50 mg by mouth daily. (Patient not taking: No sig reported)    . fluticasone (FLONASE) 50 MCG/ACT nasal spray Place 2 sprays into both nostrils daily. (Patient not taking: No sig reported) 16 g 1   No facility-administered medications prior to visit.    Allergies  Allergen Reactions  . Meloxicam Other (See Comments)    Canker sores     ROS Review of Systems  Constitutional: Negative for appetite change, chills, fatigue and fever.  HENT: Negative for congestion, dental problem, ear pain and sore throat.   Eyes: Negative for discharge, redness and visual disturbance.  Respiratory: Negative for cough, chest tightness, shortness of breath and wheezing.   Cardiovascular: Negative for chest pain, palpitations and leg swelling.  Gastrointestinal: Negative for abdominal pain, blood in stool, diarrhea, nausea and vomiting.  Genitourinary: Negative for difficulty urinating, dysuria, flank pain, frequency, hematuria and urgency.  Musculoskeletal: Negative for arthralgias, back pain, joint swelling, myalgias and neck stiffness.  Skin: Negative for pallor and rash.  Neurological: Negative for dizziness, speech difficulty, weakness and headaches.  Hematological: Negative for adenopathy. Does not bruise/bleed easily.  Psychiatric/Behavioral: Negative for confusion and sleep disturbance. The patient is not nervous/anxious.    PE; Vitals with BMI 08/05/2020 06/25/2020 06/25/2020  Height 5' 5.945" - -  Weight 150 lbs 13 oz - -  BMI 24.38 - -  Systolic 103 111 937  Diastolic 71 66 59  Pulse 61 66 62     Gen: Alert, well appearing.  Patient is oriented to person, place, time, and situation. AFFECT: pleasant, lucid thought and speech. ENT: Ears: EACs clear, normal epithelium.  TMs with good light reflex and landmarks bilaterally.  Eyes: no injection, icteris, swelling, or exudate.  EOMI, PERRLA. Nose: no drainage or turbinate edema/swelling.  No injection or focal lesion.  Mouth: lips without lesion/swelling.  Oral mucosa pink and moist.  Dentition intact and without obvious caries or gingival swelling.  Oropharynx without erythema, exudate, or swelling.  Neck: supple/nontender.  No LAD or mass.  Palpable firm thyroid nodule on L side.  No tenderness or fluctuance.  Carotid pulses 2+ bilaterally, without bruits. CV: RRR, no m/r/g.    LUNGS: CTA bilat, nonlabored resps, good aeration in all lung fields. ABD: soft, NT, ND, BS normal.  No hepatospenomegaly or mass.  No bruits. EXT: no clubbing, cyanosis, or edema.  Musculoskeletal: no joint swelling, erythema, warmth, or tenderness.  ROM of all joints intact. Skin - no sores or suspicious lesions or rashes or color changes   Pertinent labs:  Lab Results  Component Value Date   TSH 0.50 06/13/2019   Lab Results  Component Value Date   WBC 4.3 06/13/2019   HGB 13.2 06/13/2019   HCT 40.0 06/13/2019   MCV 91.6 06/13/2019  PLT 264.0 06/13/2019   Lab Results  Component Value Date   CREATININE 0.77 06/13/2019   BUN 16 06/13/2019   NA 139 06/13/2019   K 4.3 06/13/2019   CL 105 06/13/2019   CO2 25 06/13/2019   Lab Results  Component Value Date   ALT 31 06/13/2019   AST 20 06/13/2019   ALKPHOS 67 06/13/2019   BILITOT 0.5 06/13/2019   Lab Results  Component Value Date   CHOL 161 06/13/2019   Lab Results  Component Value Date   HDL 46.50 06/13/2019   Lab Results  Component Value Date   LDLCALC 103 (H) 06/13/2019   Lab Results  Component Value Date   TRIG 55.0 06/13/2019   Lab Results  Component Value Date   CHOLHDL 3 06/13/2019    ASSESSMENT AND PLAN:   Health maintenance exam: Reviewed age and gender appropriate health maintenance issues (prudent diet, regular exercise, health risks of tobacco and excessive alcohol, use of seatbelts, fire alarms in home, use of sunscreen).  Also reviewed age and gender appropriate health screening as well as vaccine recommendations. Vaccines: Tdap due->pt states she got this approx 2018 via employer.  Flu->UTD.  Shingrix->pt declines.  Covid->UTD. Labs: pt does want HP labs ->drawn today. Cervical ca screening: per GYN MD. Breast ca screening: screening mammogram was done earlier today, result pending. Colon ca screening: hx of adenomas, recall 06/2025.  Thyroid nodule, longstanding.  No recent  change. Most recent FNA benign by Dr. Dwyane Dee 09/2019. Annual f/u recommended, referral ordered again today per pt request.  An After Visit Summary was printed and given to the patient.  FOLLOW UP:  Return in about 1 year (around 08/05/2021) for annual CPE (fasting).  Signed:  Crissie Sickles, MD           08/05/2020

## 2020-08-05 ENCOUNTER — Other Ambulatory Visit: Payer: Self-pay

## 2020-08-05 ENCOUNTER — Encounter: Payer: Self-pay | Admitting: Family Medicine

## 2020-08-05 ENCOUNTER — Ambulatory Visit (HOSPITAL_BASED_OUTPATIENT_CLINIC_OR_DEPARTMENT_OTHER)
Admission: RE | Admit: 2020-08-05 | Discharge: 2020-08-05 | Disposition: A | Discharge status: Home / Self Care | Payer: No Typology Code available for payment source | Source: Ambulatory Visit | Attending: Family Medicine | Admitting: Family Medicine

## 2020-08-05 ENCOUNTER — Ambulatory Visit (INDEPENDENT_AMBULATORY_CARE_PROVIDER_SITE_OTHER): Payer: No Typology Code available for payment source | Admitting: Family Medicine

## 2020-08-05 VITALS — BP 103/71 | HR 61 | Temp 98.0°F | Resp 16 | Ht 65.95 in | Wt 150.8 lb

## 2020-08-05 DIAGNOSIS — Z1231 Encounter for screening mammogram for malignant neoplasm of breast: Secondary | ICD-10-CM | POA: Insufficient documentation

## 2020-08-05 DIAGNOSIS — E041 Nontoxic single thyroid nodule: Secondary | ICD-10-CM

## 2020-08-05 DIAGNOSIS — Z0000 Encounter for general adult medical examination without abnormal findings: Secondary | ICD-10-CM | POA: Diagnosis not present

## 2020-08-05 LAB — CBC WITH DIFFERENTIAL/PLATELET
Basophils Absolute: 0.1 10*3/uL (ref 0.0–0.1)
Basophils Relative: 1.2 % (ref 0.0–3.0)
Eosinophils Absolute: 0.1 10*3/uL (ref 0.0–0.7)
Eosinophils Relative: 1.6 % (ref 0.0–5.0)
HCT: 39.7 % (ref 36.0–46.0)
Hemoglobin: 13.3 g/dL (ref 12.0–15.0)
Lymphocytes Relative: 39 % (ref 12.0–46.0)
Lymphs Abs: 1.7 10*3/uL (ref 0.7–4.0)
MCHC: 33.5 g/dL (ref 30.0–36.0)
MCV: 92 fl (ref 78.0–100.0)
Monocytes Absolute: 0.4 10*3/uL (ref 0.1–1.0)
Monocytes Relative: 8.4 % (ref 3.0–12.0)
Neutro Abs: 2.2 10*3/uL (ref 1.4–7.7)
Neutrophils Relative %: 49.8 % (ref 43.0–77.0)
Platelets: 278 10*3/uL (ref 150.0–400.0)
RBC: 4.31 Mil/uL (ref 3.87–5.11)
RDW: 13.7 % (ref 11.5–15.5)
WBC: 4.5 10*3/uL (ref 4.0–10.5)

## 2020-08-05 LAB — COMPREHENSIVE METABOLIC PANEL
ALT: 21 U/L (ref 0–35)
AST: 17 U/L (ref 0–37)
Albumin: 4.1 g/dL (ref 3.5–5.2)
Alkaline Phosphatase: 57 U/L (ref 39–117)
BUN: 22 mg/dL (ref 6–23)
CO2: 29 mEq/L (ref 19–32)
Calcium: 9.1 mg/dL (ref 8.4–10.5)
Chloride: 104 mEq/L (ref 96–112)
Creatinine, Ser: 0.79 mg/dL (ref 0.40–1.20)
GFR: 82.76 mL/min (ref >60.00)
Glucose, Bld: 76 mg/dL (ref 70–99)
Potassium: 4.4 mEq/L (ref 3.5–5.1)
Sodium: 138 mEq/L (ref 135–145)
Total Bilirubin: 0.5 mg/dL (ref 0.2–1.2)
Total Protein: 6.8 g/dL (ref 6.0–8.3)

## 2020-08-05 LAB — LIPID PANEL
Cholesterol: 197 mg/dL (ref 0–200)
HDL: 56.4 mg/dL (ref >39.00)
LDL Cholesterol: 128 mg/dL — ABNORMAL HIGH (ref 0–99)
NonHDL: 140.46
Total CHOL/HDL Ratio: 3
Triglycerides: 63 mg/dL (ref 0.0–149.0)
VLDL: 12.6 mg/dL (ref 0.0–40.0)

## 2020-08-05 LAB — TSH: TSH: 0.66 u[IU]/mL (ref 0.35–4.50)

## 2020-08-05 NOTE — Patient Instructions (Signed)
Health Maintenance, Female Adopting a healthy lifestyle and getting preventive care are important in promoting health and wellness. Ask your health care provider about:  The right schedule for you to have regular tests and exams.  Things you can do on your own to prevent diseases and keep yourself healthy. What should I know about diet, weight, and exercise? Eat a healthy diet  Eat a diet that includes plenty of vegetables, fruits, low-fat dairy products, and lean protein.  Do not eat a lot of foods that are high in solid fats, added sugars, or sodium.   Maintain a healthy weight Body mass index (BMI) is used to identify weight problems. It estimates body fat based on height and weight. Your health care provider can help determine your BMI and help you achieve or maintain a healthy weight. Get regular exercise Get regular exercise. This is one of the most important things you can do for your health. Most adults should:  Exercise for at least 150 minutes each week. The exercise should increase your heart rate and make you sweat (moderate-intensity exercise).  Do strengthening exercises at least twice a week. This is in addition to the moderate-intensity exercise.  Spend less time sitting. Even light physical activity can be beneficial. Watch cholesterol and blood lipids Have your blood tested for lipids and cholesterol at 58 years of age, then have this test every 5 years. Have your cholesterol levels checked more often if:  Your lipid or cholesterol levels are high.  You are older than 58 years of age.  You are at high risk for heart disease. What should I know about cancer screening? Depending on your health history and family history, you may need to have cancer screening at various ages. This may include screening for:  Breast cancer.  Cervical cancer.  Colorectal cancer.  Skin cancer.  Lung cancer. What should I know about heart disease, diabetes, and high blood  pressure? Blood pressure and heart disease  High blood pressure causes heart disease and increases the risk of stroke. This is more likely to develop in people who have high blood pressure readings, are of African descent, or are overweight.  Have your blood pressure checked: ? Every 3-5 years if you are 18-39 years of age. ? Every year if you are 40 years old or older. Diabetes Have regular diabetes screenings. This checks your fasting blood sugar level. Have the screening done:  Once every three years after age 40 if you are at a normal weight and have a low risk for diabetes.  More often and at a younger age if you are overweight or have a high risk for diabetes. What should I know about preventing infection? Hepatitis B If you have a higher risk for hepatitis B, you should be screened for this virus. Talk with your health care provider to find out if you are at risk for hepatitis B infection. Hepatitis C Testing is recommended for:  Everyone born from 1945 through 1965.  Anyone with known risk factors for hepatitis C. Sexually transmitted infections (STIs)  Get screened for STIs, including gonorrhea and chlamydia, if: ? You are sexually active and are younger than 58 years of age. ? You are older than 58 years of age and your health care provider tells you that you are at risk for this type of infection. ? Your sexual activity has changed since you were last screened, and you are at increased risk for chlamydia or gonorrhea. Ask your health care provider   if you are at risk.  Ask your health care provider about whether you are at high risk for HIV. Your health care provider may recommend a prescription medicine to help prevent HIV infection. If you choose to take medicine to prevent HIV, you should first get tested for HIV. You should then be tested every 3 months for as long as you are taking the medicine. Pregnancy  If you are about to stop having your period (premenopausal) and  you may become pregnant, seek counseling before you get pregnant.  Take 400 to 800 micrograms (mcg) of folic acid every day if you become pregnant.  Ask for birth control (contraception) if you want to prevent pregnancy. Osteoporosis and menopause Osteoporosis is a disease in which the bones lose minerals and strength with aging. This can result in bone fractures. If you are 65 years old or older, or if you are at risk for osteoporosis and fractures, ask your health care provider if you should:  Be screened for bone loss.  Take a calcium or vitamin D supplement to lower your risk of fractures.  Be given hormone replacement therapy (HRT) to treat symptoms of menopause. Follow these instructions at home: Lifestyle  Do not use any products that contain nicotine or tobacco, such as cigarettes, e-cigarettes, and chewing tobacco. If you need help quitting, ask your health care provider.  Do not use street drugs.  Do not share needles.  Ask your health care provider for help if you need support or information about quitting drugs. Alcohol use  Do not drink alcohol if: ? Your health care provider tells you not to drink. ? You are pregnant, may be pregnant, or are planning to become pregnant.  If you drink alcohol: ? Limit how much you use to 0-1 drink a day. ? Limit intake if you are breastfeeding.  Be aware of how much alcohol is in your drink. In the [place] one drink equals one 12 oz bottle of beer (355 mL), one 5 oz glass of wine (148 mL), or one 1 oz glass of hard liquor (44 mL). General instructions  Schedule regular health, dental, and eye exams.  Stay current with your vaccines.  Tell your health care provider if: ? You often feel depressed. ? You have ever been abused or do not feel safe at home. Summary  Adopting a healthy lifestyle and getting preventive care are important in promoting health and wellness.  Follow your health care provider's instructions about healthy  diet, exercising, and getting tested or screened for diseases.  Follow your health care provider's instructions on monitoring your cholesterol and blood pressure. This information is not intended to replace advice given to you by your health care provider. Make sure you discuss any questions you have with your health care provider. Document Revised: 06/19/2018 Document Reviewed: 06/19/2018 Elsevier Patient Education  2021 [employer]  

## 2020-08-06 ENCOUNTER — Telehealth: Payer: Self-pay | Admitting: Family Medicine

## 2020-08-06 NOTE — Telephone Encounter (Signed)
Patient returned call for lab results. I relayed Dr. Idelle Leech message that all labs are normal. Patient voiced understanding.

## 2020-08-06 NOTE — Telephone Encounter (Signed)
noted 

## 2020-08-09 ENCOUNTER — Ambulatory Visit: Payer: No Typology Code available for payment source | Admitting: Endocrinology

## 2020-08-09 ENCOUNTER — Other Ambulatory Visit: Payer: Self-pay

## 2020-08-09 ENCOUNTER — Encounter: Payer: Self-pay | Admitting: Endocrinology

## 2020-08-09 VITALS — BP 112/78 | HR 62 | Ht 65.0 in | Wt 151.8 lb

## 2020-08-09 DIAGNOSIS — E041 Nontoxic single thyroid nodule: Secondary | ICD-10-CM

## 2020-08-09 NOTE — Progress Notes (Signed)
Patient ID: Tracy Moore, female   DOB: [birth year], 60 y.o.   MRN: [id number]    Reason for Appointment: Left thyroid nodule    History of Present Illness:   The patient's thyroid nodule was first discovered in the year 2000 incidentally by her gynecologist Initially the nodule was 14 mm in size. Biopsy was done soon after diagnosis and showed mostly colloid with a few benign follicular cells Also previously had negative thyroid antibodies  On exam the nodule measured about 2 cm in 2003 She was given a trial of thyroid suppression for sometime with levothyroxine but this caused relatively low TSH and was stopped  Recent history: She had an ultrasound rechecked in 08/2019 because of the nodule appearing larger and firmer On ultrasound the left-sided thyroid nodule was 2.5 cm with punctate echogenic foci However needle aspiration biopsy showed a benign nodule based on Afirma testing  She does not think her nodule is looking any different recently No local pressure sensation or difficulty swallowing The left lobe measured about 3.5 cm on her last exam     Lab Results  Component Value Date   TSH 0.66 08/05/2020   Office Visit on 08/05/2020  Component Date Value Ref Range Status  . TSH 08/05/2020 0.66  0.35 - 4.50 uIU/mL Final  . Cholesterol 08/05/2020 197  0 - 200 mg/dL Final   ATP III Classification       Desirable:  < 200 mg/dL               Borderline High:  200 - 239 mg/dL          High:  > = 240 mg/dL  . Triglycerides 08/05/2020 63.0  0.0 - 149.0 mg/dL Final   Normal:  <150 mg/dLBorderline High:  150 - 199 mg/dL  . HDL 08/05/2020 56.40  >39.00 mg/dL Final  . VLDL 08/05/2020 12.6  0.0 - 40.0 mg/dL Final  . LDL Cholesterol 08/05/2020 128* 0 - 99 mg/dL Final  . Total CHOL/HDL Ratio 08/05/2020 3   Final                  Men          Women1/2 Average Risk     3.4          3.3Average Risk          5.0          4.42X Average Risk          9.6          7.13X Average Risk           15.0          11.0                      . NonHDL 08/05/2020 140.46   Final   NOTE:  Non-HDL goal should be 30 mg/dL higher than patient's LDL goal (i.e. LDL goal of < 70 mg/dL, would have non-HDL goal of < 100 mg/dL)  . Sodium 08/05/2020 138  135 - 145 mEq/L Final  . Potassium 08/05/2020 4.4  3.5 - 5.1 mEq/L Final  . Chloride 08/05/2020 104  96 - 112 mEq/L Final  . CO2 08/05/2020 29  19 - 32 mEq/L Final  . Glucose, Bld 08/05/2020 76  70 - 99 mg/dL Final  . BUN 08/05/2020 22  6 - 23 mg/dL Final  . Creatinine, Ser 08/05/2020 0.79  0.40 - 1.20 mg/dL Final  .  Total Bilirubin 08/05/2020 0.5  0.2 - 1.2 mg/dL Final  . Alkaline Phosphatase 08/05/2020 57  39 - 117 U/L Final  . AST 08/05/2020 17  0 - 37 U/L Final  . ALT 08/05/2020 21  0 - 35 U/L Final  . Total Protein 08/05/2020 6.8  6.0 - 8.3 g/dL Final  . Albumin 08/05/2020 4.1  3.5 - 5.2 g/dL Final  . GFR 08/05/2020 82.76  >60.00 mL/min Final   Calculated using the CKD-EPI Creatinine Equation (2021)  . Calcium 08/05/2020 9.1  8.4 - 10.5 mg/dL Final  . WBC 08/05/2020 4.5  4.0 - 10.5 K/uL Final  . RBC 08/05/2020 4.31  3.87 - 5.11 Mil/uL Final  . Hemoglobin 08/05/2020 13.3  12.0 - 15.0 g/dL Final  . HCT 08/05/2020 39.7  36.0 - 46.0 % Final  . MCV 08/05/2020 92.0  78.0 - 100.0 fl Final  . MCHC 08/05/2020 33.5  30.0 - 36.0 g/dL Final  . RDW 08/05/2020 13.7  11.5 - 15.5 % Final  . Platelets 08/05/2020 278.0  150.0 - 400.0 K/uL Final  . Neutrophils Relative % 08/05/2020 49.8  43.0 - 77.0 % Final  . Lymphocytes Relative 08/05/2020 39.0  12.0 - 46.0 % Final  . Monocytes Relative 08/05/2020 8.4  3.0 - 12.0 % Final  . Eosinophils Relative 08/05/2020 1.6  0.0 - 5.0 % Final  . Basophils Relative 08/05/2020 1.2  0.0 - 3.0 % Final  . Neutro Abs 08/05/2020 2.2  1.4 - 7.7 K/uL Final  . Lymphs Abs 08/05/2020 1.7  0.7 - 4.0 K/uL Final  . Monocytes Absolute 08/05/2020 0.4  0.1 - 1.0 K/uL Final  . Eosinophils Absolute 08/05/2020 0.1  0.0 - 0.7 K/uL Final  .  Basophils Absolute 08/05/2020 0.1  0.0 - 0.1 K/uL Final     Allergies as of 08/09/2020      Reactions   Meloxicam Other (See Comments)   Canker sores      Medication List       Accurate as of August 09, 2020 10:15 AM. If you have any questions, ask your nurse or doctor.        5-HTP 100 MG Caps Take by mouth.   acetaminophen 325 MG tablet Commonly known as: TYLENOL Take 650 mg by mouth every 6 (six) hours as needed.   CALCIUM 500/D PO Take by mouth.   CETIRIZINE HCL PO Take 10 mg by mouth daily.   HAIR SKIN NAILS PO Take by mouth daily.   ibuprofen 200 MG tablet Commonly known as: ADVIL Take 200 mg by mouth every 6 (six) hours as needed.   levonorgestrel 20 MCG/24HR IUD Commonly known as: MIRENA 1 each by Intrauterine route once.   SM SUPER B COMPLEX/C PO Take 1 tablet by mouth daily.       Allergies:  Allergies  Allergen Reactions  . Meloxicam Other (See Comments)    Canker sores    Past Medical History:  Diagnosis Date  . Allergic rhinitis    seasonal  . History of adenomatous polyp of colon    recall 06/2025  . History of depression   . 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 [place] Crane); in situ--shave excision done, but [facility] did definitive excision: path showed residual melanoma in situ, margins free (11/01/17)  . Thyroid nodule 12/1998   L side: Bx benign.Dr. Dwyane Dee following q1-2 yrs.  Pt has been euthyroid (was on  synthroid at one point in time but this was d/c'd per pt).  F/u with endo 07/2016 showed stability.  Rpt u/s and FNA benign 09/2019 (Dr. Dwyane Dee).  . Varicose veins     Past Surgical History:  Procedure Laterality Date  . ANAL FISSURE REPAIR    . [place] and 2003  . COLONOSCOPY W/ POLYPECTOMY  08/07/2014; 06/25/20   Recall 06/2025.  Marland Kitchen KNEE ARTHROSCOPY Left 1992  . POLYPECTOMY    . thyroid nodule biopsy  2000   Benign    Family History   Problem Relation Age of Onset  . Arthritis Mother   . Hypertension Father   . Arthritis Father   . Heart disease Father   . Thyroid disease Father   . Autism Son   . Dementia Maternal Grandmother   . Cancer Maternal Grandfather        prostate  . Cancer Paternal Grandfather        pancreatic cancer  . Colon cancer Paternal Grandfather 70  . Breast cancer Maternal Aunt   . Colon polyps Neg Hx   . Esophageal cancer Neg Hx   . Stomach cancer Neg Hx   . Rectal cancer Neg Hx     Social History:  reports that she has never smoked. She has never used smokeless tobacco. She reports current alcohol use. She reports that she does not use drugs.   Review of Systems:  Wt Readings from Last 3 Encounters:  08/09/20 151 lb 12.8 oz (68.9 kg)  08/05/20 150 lb 12.8 oz (68.4 kg)  06/25/20 148 lb (67.1 kg)     Examination:   BP 112/78   Pulse 62   Ht 5\' 5"  (1.651 m)   Wt 151 lb 12.8 oz (68.9 kg)   SpO2 99%   BMI 25.26 kg/m      She has a firm 3.5-3.7 cm left thyroid lobe enlargement, overall firm and smooth  Right side is not palpable.  No lymphadenopathy in the neck    Assessment/Plan:  Long-standing solitary left-sided nodule benign on biopsy at time of diagnosis in the year 2000 and also again in 2021 Objectively does not appear much different on exam and may be minimally larger  TSH normal as before  She will follow-up annually   Elayne Snare 08/09/2020

## 2020-08-17 ENCOUNTER — Other Ambulatory Visit: Payer: Self-pay

## 2020-08-17 NOTE — Progress Notes (Signed)
error 

## 2020-10-07 ENCOUNTER — Ambulatory Visit: Payer: No Typology Code available for payment source | Admitting: Endocrinology

## 2020-12-21 ENCOUNTER — Other Ambulatory Visit: Payer: Self-pay | Admitting: Obstetrics and Gynecology

## 2020-12-21 DIAGNOSIS — E2839 Other primary ovarian failure: Secondary | ICD-10-CM

## 2021-01-24 ENCOUNTER — Other Ambulatory Visit (HOSPITAL_COMMUNITY): Payer: Self-pay

## 2021-01-24 MED ORDER — CARESTART COVID-19 HOME TEST VI KIT
PACK | 0 refills | Status: DC
  Filled 2021-01-24: qty 4, 4d supply, fill #0

## 2021-02-03 ENCOUNTER — Other Ambulatory Visit (HOSPITAL_BASED_OUTPATIENT_CLINIC_OR_DEPARTMENT_OTHER): Payer: No Typology Code available for payment source

## 2021-02-10 ENCOUNTER — Other Ambulatory Visit: Payer: Self-pay

## 2021-02-10 ENCOUNTER — Ambulatory Visit (HOSPITAL_BASED_OUTPATIENT_CLINIC_OR_DEPARTMENT_OTHER)
Admission: RE | Admit: 2021-02-10 | Discharge: 2021-02-10 | Disposition: A | Discharge status: Home / Self Care | Payer: No Typology Code available for payment source | Source: Ambulatory Visit | Attending: Obstetrics and Gynecology | Admitting: Obstetrics and Gynecology

## 2021-02-10 DIAGNOSIS — E2839 Other primary ovarian failure: Secondary | ICD-10-CM | POA: Diagnosis present

## 2021-05-16 ENCOUNTER — Other Ambulatory Visit: Payer: No Typology Code available for payment source

## 2021-05-16 ENCOUNTER — Other Ambulatory Visit: Payer: Self-pay

## 2021-05-17 ENCOUNTER — Other Ambulatory Visit (HOSPITAL_COMMUNITY)
Admission: RE | Admit: 2021-05-17 | Discharge: 2021-05-17 | Disposition: A | Discharge status: Home / Self Care | Payer: No Typology Code available for payment source | Source: Ambulatory Visit | Attending: Endocrinology | Admitting: Endocrinology

## 2021-05-17 DIAGNOSIS — E041 Nontoxic single thyroid nodule: Secondary | ICD-10-CM | POA: Insufficient documentation

## 2021-05-17 LAB — TSH: TSH: 0.897 u[IU]/mL (ref 0.350–4.500)

## 2021-05-20 ENCOUNTER — Other Ambulatory Visit: Payer: Self-pay

## 2021-05-20 ENCOUNTER — Encounter: Payer: Self-pay | Admitting: Endocrinology

## 2021-05-20 ENCOUNTER — Ambulatory Visit: Payer: No Typology Code available for payment source | Admitting: Endocrinology

## 2021-05-20 VITALS — BP 110/76 | HR 76 | Ht 65.0 in | Wt 151.4 lb

## 2021-05-20 DIAGNOSIS — E041 Nontoxic single thyroid nodule: Secondary | ICD-10-CM | POA: Diagnosis not present

## 2021-05-20 NOTE — Progress Notes (Signed)
Patient ID: Tracy Moore, female   DOB: 1961-06-26, 60 y.o.   MRN: [id number]    Reason for Appointment: Left thyroid nodule    History of Present Illness:   The patient's thyroid nodule was first discovered in the year 2000 incidentally by her gynecologist Initially the nodule was 14 mm in size. Biopsy was done soon after diagnosis and showed mostly colloid with a few benign follicular cells Also previously had negative thyroid antibodies  On exam the nodule measured about 2 cm in 2003 She was given a trial of thyroid suppression for sometime with levothyroxine but this caused relatively low TSH and was stopped  Recent history: She had an ultrasound rechecked in 08/2019 because of the nodule appearing larger and firmer On ultrasound the left-sided thyroid nodule was 2.5 cm with punctate echogenic foci although isoechoic and density  However needle aspiration biopsy showed a benign nodule based on Afirma testing, cytology showed Atypia of undetermined significance  She came in today because she thinks her nodule is larger She also feels the nodule especially when she is looking down or swallowing  However has no difficulty with swallowing or choking sensation Thyroid level is normal as before    Lab Results  Component Value Date   TSH [facility] 05/17/2021   Hospital Outpatient Visit on 05/17/2021  Component Date Value Ref Range Status   TSH 05/17/2021 0.897  0.350 - 4.500 uIU/mL Final   Comment: Performed by a 3rd Generation assay with a functional sensitivity of <=0.01 uIU/mL. Performed at [facility], [place] [address], [place], [place] [address]      Allergies as of 05/20/2021       Reactions   Meloxicam Other (See Comments)   Canker sores        Medication List        Accurate as of May 20, 2021 10:32 AM. If you have any questions, ask your nurse or doctor.          5-HTP 100 MG Caps Take by mouth.   acetaminophen 325 MG tablet Commonly known  as: TYLENOL Take 650 mg by mouth every 6 (six) hours as needed.   CALCIUM 500/D PO Take by mouth.   Carestart COVID-19 Home Test Kit Generic drug: COVID-19 At Home Antigen Test use as directed   CETIRIZINE HCL PO Take 10 mg by mouth daily.   HAIR SKIN NAILS PO Take by mouth daily.   ibuprofen 200 MG tablet Commonly known as: ADVIL Take 200 mg by mouth every 6 (six) hours as needed.   levonorgestrel 20 MCG/24HR IUD Commonly known as: MIRENA 1 each by Intrauterine route once.   SM SUPER B COMPLEX/C PO Take 1 tablet by mouth daily.   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 [place] Crane); in situ--shave excision done, but [facility] did definitive excision: path showed residual melanoma in situ, margins free (11/01/17)   Thyroid nodule 12/1998   L side: Bx  benign.Dr. Dwyane Dee following q1-2 yrs.  Pt has been euthyroid (was on synthroid at one point in time but this was d/c'd per pt).  F/u with endo 07/2016 showed stability.  Rpt u/s and FNA benign 09/2019 (Dr. Dwyane Dee).   Varicose veins     Past Surgical History:  Procedure Laterality Date   ANAL FISSURE REPAIR     CESAREAN SECTION  1997 and 2003   COLONOSCOPY W/ POLYPECTOMY  08/07/2014; 06/25/20   Recall 06/2025.   KNEE ARTHROSCOPY Left 1992   POLYPECTOMY     thyroid nodule biopsy  2000   Benign    Family History  Problem Relation Age of Onset   Arthritis Mother    Hypertension Father    Arthritis Father    Heart disease Father    Thyroid disease Father    Autism Son     Dementia Maternal Grandmother    Cancer Maternal Grandfather        prostate   Cancer Paternal Grandfather        pancreatic cancer   Colon cancer Paternal Grandfather 72   Breast cancer Maternal Aunt    Colon polyps Neg Hx    Esophageal cancer Neg Hx    Stomach cancer Neg Hx    Rectal cancer Neg Hx     Social History:  reports that she has never smoked. She has never used smokeless tobacco. She reports current alcohol use. She reports that she does not use drugs.   Review of Systems:     Examination:   BP 110/76 (BP Location: Left Arm, Patient Position: Sitting, Cuff Size: Normal)   Pulse 76   Ht _0  (1.651 m)   Wt 151 lb 6.4 oz (68.7 kg)   SpO2 98%   BMI 25.19 kg/m      Left thyroid nodule measures about 4 cm relatively more firm in the center   Right side is not palpable.  No lymphadenopathy in the neck    Assessment/Plan:  Long-standing solitary left-sided nodule benign on biopsy at time of diagnosis in the year 2000 and also again in 2021 She does appear to have mild enlargement of the nodule and she is noticing some local pressure Previously the nodule was solid, isoechoic but showed AUS   Ultrasound will be repeated to identify any significant change, currently patient has only mild symptoms which do not indicate need for surgery We will discuss results when available  Elayne Snare 05/20/2021

## 2021-06-16 ENCOUNTER — Ambulatory Visit
Admission: RE | Admit: 2021-06-16 | Discharge: 2021-06-16 | Disposition: A | Discharge status: Home / Self Care | Payer: No Typology Code available for payment source | Source: Ambulatory Visit | Attending: Endocrinology | Admitting: Endocrinology

## 2021-06-16 DIAGNOSIS — E041 Nontoxic single thyroid nodule: Secondary | ICD-10-CM

## 2021-06-20 ENCOUNTER — Telehealth: Payer: Self-pay

## 2021-06-20 ENCOUNTER — Telehealth: Payer: Self-pay | Admitting: Endocrinology

## 2021-06-20 NOTE — Telephone Encounter (Signed)
-----   Message from Elayne Snare, MD sent at 06/20/2021  9:49 AM EST ----- No significant change, to return in 11/23

## 2021-06-20 NOTE — Telephone Encounter (Signed)
Attempted to contact the patient regarding [place] results, LVM for a call back.

## 2021-06-20 NOTE — Telephone Encounter (Signed)
PT is returning call received today. Pt ask for another call back ASAP. [phone]

## 2021-06-20 NOTE — Telephone Encounter (Signed)
Patient now informed of ultrasound results

## 2021-06-22 ENCOUNTER — Other Ambulatory Visit (HOSPITAL_BASED_OUTPATIENT_CLINIC_OR_DEPARTMENT_OTHER): Payer: Self-pay | Admitting: Obstetrics and Gynecology

## 2021-06-22 DIAGNOSIS — Z1231 Encounter for screening mammogram for malignant neoplasm of breast: Secondary | ICD-10-CM

## 2021-07-07 ENCOUNTER — Other Ambulatory Visit: Payer: No Typology Code available for payment source

## 2021-08-05 ENCOUNTER — Encounter: Payer: No Typology Code available for payment source | Admitting: Family Medicine

## 2021-08-29 ENCOUNTER — Other Ambulatory Visit: Payer: Self-pay

## 2021-08-29 ENCOUNTER — Encounter (HOSPITAL_BASED_OUTPATIENT_CLINIC_OR_DEPARTMENT_OTHER): Payer: Self-pay

## 2021-08-29 ENCOUNTER — Ambulatory Visit (HOSPITAL_BASED_OUTPATIENT_CLINIC_OR_DEPARTMENT_OTHER)
Admission: RE | Admit: 2021-08-29 | Discharge: 2021-08-29 | Disposition: A | Discharge status: Home / Self Care | Payer: No Typology Code available for payment source | Source: Ambulatory Visit | Attending: Obstetrics and Gynecology | Admitting: Obstetrics and Gynecology

## 2021-08-29 DIAGNOSIS — Z1231 Encounter for screening mammogram for malignant neoplasm of breast: Secondary | ICD-10-CM

## 2021-09-01 ENCOUNTER — Telehealth: Payer: Self-pay | Admitting: Family Medicine

## 2021-09-01 NOTE — Telephone Encounter (Deleted)
Tye Maryland called she wanted to speak to an Glass blower/designer.  Tye Maryland called yesterday in regards to her son. He is a pt of Dr. Anitra Lauth her son has medicaid. She received a letter from [facility] that they have the "[facility] that is administrated by [employer]. I informed Drue Dun about the insurance. We would do further investigation on whether or not the office accepts this insurance.  Tye Maryland said she received a letter from [facility] last week informing her that if she doesn't choose a provider by the Monday 09/05/2021. They will choose one for her.  She only needs to know if we accept the insurance or not before Monday 09/05/2021.  I gave [employer]) Izora Gala office number.

## 2021-09-02 NOTE — Telephone Encounter (Signed)
Error incorrect chart 

## 2021-10-07 ENCOUNTER — Ambulatory Visit (INDEPENDENT_AMBULATORY_CARE_PROVIDER_SITE_OTHER): Payer: No Typology Code available for payment source | Admitting: Family Medicine

## 2021-10-07 ENCOUNTER — Encounter: Payer: Self-pay | Admitting: Family Medicine

## 2021-10-07 VITALS — BP 99/64 | HR 67 | Temp 98.0°F | Ht 66.0 in | Wt 155.2 lb

## 2021-10-07 DIAGNOSIS — Z0000 Encounter for general adult medical examination without abnormal findings: Secondary | ICD-10-CM

## 2021-10-07 LAB — COMPREHENSIVE METABOLIC PANEL
ALT: 27 U/L (ref 0–35)
AST: 17 U/L (ref 0–37)
Albumin: 4.2 g/dL (ref 3.5–5.2)
Alkaline Phosphatase: 69 U/L (ref 39–117)
BUN: 16 mg/dL (ref 6–23)
CO2: 28 mEq/L (ref 19–32)
Calcium: 9 mg/dL (ref 8.4–10.5)
Chloride: 104 mEq/L (ref 96–112)
Creatinine, Ser: 0.72 mg/dL (ref 0.40–1.20)
GFR: 91.75 mL/min (ref >60.00)
Glucose, Bld: 78 mg/dL (ref 70–99)
Potassium: 4.1 mEq/L (ref 3.5–5.1)
Sodium: 138 mEq/L (ref 135–145)
Total Bilirubin: 0.4 mg/dL (ref 0.2–1.2)
Total Protein: 6.8 g/dL (ref 6.0–8.3)

## 2021-10-07 LAB — LIPID PANEL
Cholesterol: 204 mg/dL — ABNORMAL HIGH (ref 0–200)
HDL: 57.4 mg/dL (ref >39.00)
LDL Cholesterol: 126 mg/dL — ABNORMAL HIGH (ref 0–99)
NonHDL: 146.75
Total CHOL/HDL Ratio: 4
Triglycerides: 105 mg/dL (ref 0.0–149.0)
VLDL: 21 mg/dL (ref 0.0–40.0)

## 2021-10-07 LAB — CBC
HCT: 38.3 % (ref 36.0–46.0)
Hemoglobin: 12.8 g/dL (ref 12.0–15.0)
MCHC: 33.4 g/dL (ref 30.0–36.0)
MCV: 93.8 fl (ref 78.0–100.0)
Platelets: 290 10*3/uL (ref 150.0–400.0)
RBC: 4.09 Mil/uL (ref 3.87–5.11)
RDW: 13.4 % (ref 11.5–15.5)
WBC: 4.2 10*3/uL (ref 4.0–10.5)

## 2021-10-07 LAB — TSH: TSH: 1.22 u[IU]/mL (ref 0.35–5.50)

## 2021-10-07 NOTE — Patient Instructions (Signed)

## 2021-10-07 NOTE — Progress Notes (Signed)
Office Note ?10/07/2021 ? ?CC:  ?Chief Complaint  ?Patient presents with  ? Annual Exam  ?  Pt is fasting  ? ? ?HPI:  ?Patient is a 61 y.o. female who is here for annual health maintenance exam. ? ?Feeling well. ?Working full time as Therapist, sports. 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 ?No exercise. ? ?Past Medical History:  ?Diagnosis Date  ? Allergic rhinitis   ? seasonal  ? History of adenomatous polyp of colon   ? recall 06/2025  ? History of depression   ? 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 ortho->nonop mgmt  ? Melanoma ([place]) 08/2006  ? Righ thigh (Dr. [place] Crane); in situ--shave excision done, but [facility] did definitive excision: path showed residual melanoma in situ, margins free (11/01/17)  ? Thyroid nodule 12/1998  ? L side: Bx benign.Dr. Dwyane Dee following q1-2 yrs.  Pt has been euthyroid (was on synthroid at one point in time but this was d/c'd per pt).  F/u with endo 07/2016 showed stability.  Rpt u/s and FNA benign 09/2019 (Dr. Dwyane Dee). No signif change in nodule 06/2021  ? Varicose veins   ? ? ?Past Surgical History:  ?Procedure Laterality Date  ? ANAL FISSURE REPAIR    ? [place] and 2003  ? COLONOSCOPY W/ POLYPECTOMY  08/07/2014; 06/25/20  ? Recall 06/2025.  ? KNEE ARTHROSCOPY Left 1992  ? POLYPECTOMY    ? thyroid nodule biopsy  2000  ? Benign  ? ? ?Family History  ?Problem Relation Age of Onset  ? Arthritis Mother   ? Hypertension Father   ? Arthritis Father   ? Heart disease Father   ? Thyroid disease Father   ? Autism Son   ? Dementia Maternal Grandmother   ? Cancer Maternal Grandfather   ?     prostate  ? Cancer Paternal Grandfather   ?     pancreatic cancer  ? Colon cancer Paternal Grandfather 41  ? Breast cancer Maternal Aunt   ? Colon polyps Neg Hx   ? Esophageal cancer Neg Hx   ? Stomach cancer Neg Hx   ? Rectal cancer Neg Hx   ? ? ?Social History  ? ?Socioeconomic History  ? Marital status: Married  ?  Spouse name: Not on file  ? Number of children: Not on file  ? Years of  education: Not on file  ? Highest education level: Not on file  ?Occupational History  ? Not on file  ?Tobacco Use  ? Smoking status: Never  ? Smokeless tobacco: Never  ?Vaping Use  ? Vaping Use: Never used  ?Substance and Sexual Activity  ? Alcohol use: Yes  ?  Alcohol/week: 0.0 standard drinks  ?  Comment: socially  ? Drug use: No  ? Sexual activity: Not on file  ?Other Topics Concern  ? Not on file  ?Social History Narrative  ? Married, daughter 91 y/o , son 76 y/o (has autism).  ? Lives in [place].  ? Occupation: Therapist, sports at [employer].  ? No tobacco.  Occ alc.  No drugs.  ? ?Social Determinants of Health  ? ?Financial Resource Strain: Not on file  ?Food Insecurity: Not on file  ?Transportation Needs: Not on file  ?Physical Activity: Not on file  ?Stress: Not on file  ?Social Connections: Not on file  ?Intimate Partner Violence: Not on file  ? ? ?Outpatient Medications Prior to Visit  ?Medication Sig Dispense Refill  ? 5-Hydroxytryptophan (5-HTP) 200  MG TABS Take by mouth.    ? acetaminophen (TYLENOL) 325 MG tablet Take 650 mg by mouth every 6 (six) hours as needed.    ? ALLEGRA ALLERGY 60 MG tablet SMARTSIG:1 By Mouth    ? COVID-19 At Home Antigen Test [facility] COVID-19 HOME TEST) KIT use as directed 4 each 0  ? ibuprofen (ADVIL,MOTRIN) 200 MG tablet Take 200 mg by mouth every 6 (six) hours as needed.    ? Multiple Vitamins-Minerals (HAIR SKIN NAILS PO) Take by mouth daily.     ? mupirocin ointment (BACTROBAN) 2 % Apply 1 application. topically daily.    ? SM SUPER B COMPLEX/C PO Take 1 tablet by mouth daily.    ? Calcium Carb-Cholecalciferol (CALCIUM 500/D PO) Take by mouth. (Patient not taking: Reported on 10/07/2021)    ? CETIRIZINE HCL PO Take 10 mg by mouth daily. (Patient not taking: Reported on 10/07/2021)    ? levonorgestrel (MIRENA) 20 MCG/24HR IUD 1 each by Intrauterine route once. (Patient not taking: Reported on 10/07/2021)    ? ?No facility-administered medications prior to visit.   ? ? ?Allergies  ?Allergen Reactions  ? Meloxicam Other (See Comments)  ?  Canker sores  ? ? ?ROS ?Review of Systems  ?Constitutional:  Negative for appetite change, chills, fatigue and fever.  ?HENT:  Negative for congestion, dental problem, ear pain and sore throat.   ?Eyes:  Negative for discharge, redness and visual disturbance.  ?Respiratory:  Negative for cough, chest tightness, shortness of breath and wheezing.   ?Cardiovascular:  Negative for chest pain, palpitations and leg swelling.  ?Gastrointestinal:  Negative for abdominal pain, blood in stool, diarrhea, nausea and vomiting.  ?Genitourinary:  Negative for difficulty urinating, dysuria, flank pain, frequency, hematuria and urgency.  ?Musculoskeletal:  Negative for arthralgias, back pain, joint swelling, myalgias and neck stiffness.  ?Skin:  Negative for pallor and rash.  ?Neurological:  Negative for dizziness, speech difficulty, weakness and headaches.  ?Hematological:  Negative for adenopathy. Does not bruise/bleed easily.  ?Psychiatric/Behavioral:  Negative for confusion and sleep disturbance. The patient is not nervous/anxious.   ? ?PE; ? ?  10/07/2021  ? 11:02 AM 05/20/2021  ? 10:28 AM 08/09/2020  ? 10:09 AM  ?Vitals with BMI  ?Height 5' 6"  5' 5"  5' 5"   ?Weight 155 lbs 3 oz 151 lbs 6 oz 151 lbs 13 oz  ?BMI 25.06 25.19 25.26  ?Systolic 99 427 062  ?Diastolic 64 76 78  ?Pulse 67 76 62  ? ? ?Exam chaperoned by Deveron Furlong, CMA. ?Gen: Alert, well appearing.  Patient is oriented to person, place, time, and situation. ?AFFECT: pleasant, lucid thought and speech. ?ENT: Ears: EACs clear, normal epithelium.  TMs with good light reflex and landmarks bilaterally.  Eyes: no injection, icteris, swelling, or exudate.  EOMI, PERRLA. ?Nose: no drainage or turbinate edema/swelling.  No injection or focal lesion.  Mouth: lips without lesion/swelling.  Oral mucosa pink and moist.  Dentition intact and without obvious caries or gingival swelling.  Oropharynx without  erythema, exudate, or swelling.  ?Neck: supple/nontender.  No LAD, mass, or TM.  Carotid pulses 2+ bilaterally, without bruits. ?CV: RRR, no m/r/g.   ?LUNGS: CTA bilat, nonlabored resps, good aeration in all lung fields. ?ABD: soft, NT, ND, BS normal.  No hepatospenomegaly or mass.  No bruits. ?EXT: no clubbing, cyanosis, or edema.  ?Musculoskeletal: no joint swelling, erythema, warmth, or tenderness.  ROM of all joints intact. ?Skin - no sores or suspicious lesions or rashes or  color changes ? ?Pertinent labs:  ?Lab Results  ?Component Value Date  ? TSH 0.897 05/17/2021  ? ?Lab Results  ?Component Value Date  ? WBC 4.5 08/05/2020  ? HGB 13.3 08/05/2020  ? HCT 39.7 08/05/2020  ? MCV 92.0 08/05/2020  ? PLT 278.0 08/05/2020  ? ?Lab Results  ?Component Value Date  ? CREATININE 0.79 08/05/2020  ? BUN 22 08/05/2020  ? NA 138 08/05/2020  ? K 4.4 08/05/2020  ? CL 104 08/05/2020  ? CO2 29 08/05/2020  ? ?Lab Results  ?Component Value Date  ? ALT 21 08/05/2020  ? AST 17 08/05/2020  ? ALKPHOS 57 08/05/2020  ? BILITOT 0.5 08/05/2020  ? ?Lab Results  ?Component Value Date  ? CHOL 197 08/05/2020  ? ?Lab Results  ?Component Value Date  ? HDL 56.40 08/05/2020  ? ?Lab Results  ?Component Value Date  ? [place] 128 (H) 08/05/2020  ? ?Lab Results  ?Component Value Date  ? TRIG 63.0 08/05/2020  ? ?Lab Results  ?Component Value Date  ? CHOLHDL 3 08/05/2020  ? ?ASSESSMENT AND PLAN:  ? ?Health maintenance exam: ?Reviewed age and gender appropriate health maintenance issues (prudent diet, regular exercise, health risks of tobacco and excessive alcohol, use of seatbelts, fire alarms in home, use of sunscreen).  Also reviewed age and gender appropriate health screening as well as vaccine recommendations. ?Vaccines: Shingrix->declined.  O/w UTD. ?Labs: fasting HP labs ordered. ?Cervical ca screening: per GYN MD. ?Breast ca screening: NEG 08/2021, rpt 1 yr. ?Colon ca screening: recall 06/2025. ? ?An After Visit Summary was printed and given to  the patient. ? ?FOLLOW UP:  Return in about 1 year (around 10/08/2022) for annual CPE (fasting). ? ?Signed:  Crissie Sickles, MD           10/07/2021 ? ? ?

## 2022-10-04 ENCOUNTER — Ambulatory Visit (INDEPENDENT_AMBULATORY_CARE_PROVIDER_SITE_OTHER): Payer: BC Managed Care – PPO | Admitting: Family Medicine

## 2022-10-04 ENCOUNTER — Encounter: Payer: Self-pay | Admitting: Family Medicine

## 2022-10-04 VITALS — BP 112/75 | HR 74 | Ht 65.0 in | Wt 166.6 lb

## 2022-10-04 DIAGNOSIS — E041 Nontoxic single thyroid nodule: Secondary | ICD-10-CM

## 2022-10-04 DIAGNOSIS — Z0000 Encounter for general adult medical examination without abnormal findings: Secondary | ICD-10-CM

## 2022-10-04 LAB — TSH: TSH: 0.75 u[IU]/mL (ref 0.35–5.50)

## 2022-10-04 LAB — COMPREHENSIVE METABOLIC PANEL
ALT: 18 U/L (ref 0–35)
AST: 16 U/L (ref 0–37)
Albumin: 4.1 g/dL (ref 3.5–5.2)
Alkaline Phosphatase: 79 U/L (ref 39–117)
BUN: 18 mg/dL (ref 6–23)
CO2: 27 mEq/L (ref 19–32)
Calcium: 9.2 mg/dL (ref 8.4–10.5)
Chloride: 106 mEq/L (ref 96–112)
Creatinine, Ser: 0.82 mg/dL (ref 0.40–1.20)
GFR: 77.95 mL/min (ref >60.00)
Glucose, Bld: 90 mg/dL (ref 70–99)
Potassium: 4.5 mEq/L (ref 3.5–5.1)
Sodium: 139 mEq/L (ref 135–145)
Total Bilirubin: 0.4 mg/dL (ref 0.2–1.2)
Total Protein: 7 g/dL (ref 6.0–8.3)

## 2022-10-04 LAB — CBC WITH DIFFERENTIAL/PLATELET
Basophils Absolute: 0 10*3/uL (ref 0.0–0.1)
Basophils Relative: 1.1 % (ref 0.0–3.0)
Eosinophils Absolute: 0.1 10*3/uL (ref 0.0–0.7)
Eosinophils Relative: 2.2 % (ref 0.0–5.0)
HCT: 40.1 % (ref 36.0–46.0)
Hemoglobin: 13.4 g/dL (ref 12.0–15.0)
Lymphocytes Relative: 36.1 % (ref 12.0–46.0)
Lymphs Abs: 1.5 10*3/uL (ref 0.7–4.0)
MCHC: 33.6 g/dL (ref 30.0–36.0)
MCV: 91 fl (ref 78.0–100.0)
Monocytes Absolute: 0.4 10*3/uL (ref 0.1–1.0)
Monocytes Relative: 9.5 % (ref 3.0–12.0)
Neutro Abs: 2.1 10*3/uL (ref 1.4–7.7)
Neutrophils Relative %: 51.1 % (ref 43.0–77.0)
Platelets: 324 10*3/uL (ref 150.0–400.0)
RBC: 4.4 Mil/uL (ref 3.87–5.11)
RDW: 13.9 % (ref 11.5–15.5)
WBC: 4.1 10*3/uL (ref 4.0–10.5)

## 2022-10-04 LAB — T4, FREE: Free T4: 0.98 ng/dL (ref 0.60–1.60)

## 2022-10-04 LAB — LIPID PANEL
Cholesterol: 211 mg/dL — ABNORMAL HIGH (ref 0–200)
HDL: 57.4 mg/dL (ref >39.00)
LDL Cholesterol: 138 mg/dL — ABNORMAL HIGH (ref 0–99)
NonHDL: 154.06
Total CHOL/HDL Ratio: 4
Triglycerides: 78 mg/dL (ref 0.0–149.0)
VLDL: 15.6 mg/dL (ref 0.0–40.0)

## 2022-10-04 NOTE — Progress Notes (Signed)
Office Note 10/04/2022  CC:  Chief Complaint  Patient presents with   Annual Exam    Fasting CPE no pap needed    Patient is a 62 y.o. female who is here for annual health maintenance exam. A/P as of last visit: "Health maintenance exam: Reviewed age and gender appropriate health maintenance issues (prudent diet, regular exercise, health risks of tobacco and excessive alcohol, use of seatbelts, fire alarms in home, use of sunscreen).  Also reviewed age and gender appropriate health screening as well as vaccine recommendations. Vaccines: Tdap due->pt states she got this approx 2018 via employer.  Flu->UTD.  Shingrix->pt declines.  Covid->UTD. Labs: pt does want HP labs ->drawn today. Cervical ca screening: per GYN MD. Breast ca screening: screening mammogram was done earlier today, result pending. Colon ca screening: hx of adenomas, recall 06/2025.   Thyroid nodule, longstanding.  No recent change. Most recent FNA benign by Dr. Dwyane Dee 09/2019. Annual f/u recommended, referral ordered again today per pt request."  INTERIM HX: Feeling well. 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 Nevada Crane); in situ--shave excision done, but [facility] did definitive excision: path showed residual melanoma in situ, margins free (11/01/17)   Osteoarthritis of finger    Thyroid nodule 12/1998   L side: Bx benign.Dr. Dwyane Dee following q1-2 yrs.  Pt has been euthyroid (was on synthroid at one point in time but this was d/c'd per pt).  F/u with endo 07/2016 showed stability.  Rpt u/s and FNA benign 09/2019 (Dr. Dwyane Dee). No signif change in nodule 06/2021   Varicose veins     Past Surgical History:  Procedure Laterality Date   ANAL FISSURE REPAIR      CESAREAN SECTION  1997 and 2003   COLONOSCOPY W/ POLYPECTOMY  08/07/2014; 06/25/20   Recall 06/2025.   KNEE ARTHROSCOPY Left 1992   POLYPECTOMY     thyroid nodule biopsy  2000   Benign    Family History  Problem Relation Age of Onset   Arthritis Mother    Hypertension Father    Arthritis Father    Heart disease Father    Thyroid disease Father    Autism Son    Dementia Maternal Grandmother    Cancer Maternal Grandfather        prostate   Cancer Paternal Grandfather        pancreatic cancer   Colon cancer Paternal Grandfather 71   Breast cancer Maternal Aunt    Colon polyps Neg Hx    Esophageal cancer Neg Hx    Stomach cancer Neg Hx    Rectal cancer Neg Hx     Social History   Socioeconomic History   Marital status: Married    Spouse name: Not on file   Number of children: Not on file   Years of education: Not on file   Highest education level: Not on file  Occupational History   Not on file  Tobacco Use   Smoking status: Never   Smokeless tobacco: Never  Vaping Use   Vaping Use: Never used  Substance and Sexual Activity   Alcohol use: Yes    Alcohol/week: 0.0 standard drinks of alcohol  Comment: socially   Drug use: No   Sexual activity: Not on file  Other Topics Concern   Not on file  Social History Narrative   Married, daughter 55 y/o , son 97 y/o (has autism).   Lives in [place].   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  No drugs.   Social Determinants of Health   Financial Resource Strain: Not on file  Food Insecurity: Not on file  Transportation Needs: Not on file  Physical Activity: Not on file  Stress: Not on file  Social Connections: Not on file  Intimate Partner Violence: Not on file    Outpatient Medications Prior to Visit  Medication Sig Dispense Refill   5-Hydroxytryptophan (5-HTP) 200 MG TABS Take by mouth.     acetaminophen (TYLENOL) 325 MG tablet Take 650 mg by mouth every 6 (six) hours as  needed.     CETIRIZINE HCL PO Take 10 mg by mouth daily.     ibuprofen (ADVIL,MOTRIN) 200 MG tablet Take 200 mg by mouth every 6 (six) hours as needed.     Turmeric (QC TUMERIC COMPLEX) 500 MG CAPS Take by mouth.     ALLEGRA ALLERGY 60 MG tablet SMARTSIG:1 By Mouth     Calcium Carb-Cholecalciferol (CALCIUM 500/D PO) Take by mouth. (Patient not taking: Reported on 10/07/2021)     Multiple Vitamins-Minerals (HAIR SKIN NAILS PO) Take by mouth daily.      mupirocin ointment (BACTROBAN) 2 % Apply 1 application. topically daily.     SM SUPER B COMPLEX/C PO Take 1 tablet by mouth daily.     COVID-19 At Home Antigen Test [facility] COVID-19 HOME TEST) KIT use as directed 4 each 0   No facility-administered medications prior to visit.    Allergies  Allergen Reactions   Meloxicam Other (See Comments)    Canker sores    Review of Systems  Constitutional:  Negative for appetite change, chills, fatigue and fever.  HENT:  Negative for congestion, dental problem, ear pain and sore throat.   Eyes:  Negative for discharge, redness and visual disturbance.  Respiratory:  Negative for cough, chest tightness, shortness of breath and wheezing.   Cardiovascular:  Negative for chest pain, palpitations and leg swelling.  Gastrointestinal:  Negative for abdominal pain, blood in stool, diarrhea, nausea and vomiting.  Genitourinary:  Negative for difficulty urinating, dysuria, flank pain, frequency, hematuria and urgency.  Musculoskeletal:  Negative for arthralgias, back pain, joint swelling, myalgias and neck stiffness.  Skin:  Negative for pallor and rash.  Neurological:  Negative for dizziness, speech difficulty, weakness and headaches.  Hematological:  Negative for adenopathy. Does not bruise/bleed easily.  Psychiatric/Behavioral:  Negative for confusion and sleep disturbance. The patient is not nervous/anxious.     PE;    10/04/2022   11:27 AM 10/07/2021   11:02 AM 05/20/2021   10:28 AM  Vitals with  BMI  Height 5\' 5"  5\' 6"  5\' 5"   Weight 166 lbs 10 oz 155 lbs 3 oz 151 lbs 6 oz  BMI 27.72 123456 [id number]  Systolic [id number]  Diastolic 75 64 76  Pulse 74 67 76   Exam chaperoned by Deveron Furlong, CMA. Gen: Alert, well appearing.  Patient is oriented to person, place, time, and situation. AFFECT: pleasant, lucid thought and speech. ENT: Ears: EACs clear, normal epithelium.  TMs with good light reflex and landmarks bilaterally.  Eyes: no injection, icteris, swelling, or exudate.  EOMI, PERRLA. Nose:  no drainage or turbinate edema/swelling.  No injection or focal lesion.  Mouth: lips without lesion/swelling.  Oral mucosa pink and moist.  Dentition intact and without obvious caries or gingival swelling.  Oropharynx without erythema, exudate, or swelling.  Neck: supple/nontender.  No LAD, mass, or TM.  Carotid pulses 2+ bilaterally, without bruits. CV: RRR, no m/r/g.   LUNGS: CTA bilat, nonlabored resps, good aeration in all lung fields. ABD: soft, NT, ND, BS normal.  No hepatospenomegaly or mass.  No bruits. EXT: no clubbing, cyanosis, or edema.  Musculoskeletal: no joint swelling, erythema, warmth, or tenderness.  ROM of all joints intact. Skin - no sores or suspicious lesions or rashes or color changes  Pertinent labs:  Lab Results  Component Value Date   TSH 1.22 10/07/2021   Lab Results  Component Value Date   WBC 4.2 10/07/2021   HGB 12.8 10/07/2021   HCT 38.3 10/07/2021   MCV 93.8 10/07/2021   PLT 290.0 10/07/2021   Lab Results  Component Value Date   CREATININE 0.72 10/07/2021   BUN 16 10/07/2021   NA 138 10/07/2021   K 4.1 10/07/2021   CL 104 10/07/2021   CO2 28 10/07/2021   Lab Results  Component Value Date   ALT 27 10/07/2021   AST 17 10/07/2021   ALKPHOS 69 10/07/2021   BILITOT 0.4 10/07/2021   Lab Results  Component Value Date   CHOL 204 (H) 10/07/2021   Lab Results  Component Value Date   HDL 57.40 10/07/2021   Lab Results  Component Value Date    LDLCALC 126 (H) 10/07/2021   Lab Results  Component Value Date   TRIG 105.0 10/07/2021   Lab Results  Component Value Date   CHOLHDL 4 10/07/2021   ASSESSMENT AND PLAN:   Health maintenance exam: Reviewed age and gender appropriate health maintenance issues (prudent diet, regular exercise, health risks of tobacco and excessive alcohol, use of seatbelts, fire alarms in home, use of sunscreen).  Also reviewed age and gender appropriate health screening as well as vaccine recommendations. Vaccines: Tdap due->pt states she got this approx 2018 via employer.  Flu->Shingrix->pt declines. Labs: pt does want HP labs ->drawn today.  Panel drawn as well for follow-up of her thyroid nodule--will forward results to her endocrinologist Dr. Dwyane Dee Cervical ca screening: per GYN MD. Breast ca screening: screening mammogram was last done February 2023.  Gets this via GYN MD. Colon ca screening: hx of adenomas, recall 06/2025.  An After Visit Summary was printed and given to the patient.  FOLLOW UP:  Return in about 1 year (around 10/04/2023) for annual CPE (fasting).  Signed:  Crissie Sickles, MD           10/04/2022

## 2022-10-04 NOTE — Patient Instructions (Signed)

## 2022-10-05 ENCOUNTER — Encounter: Payer: Self-pay | Admitting: Endocrinology

## 2022-10-05 ENCOUNTER — Ambulatory Visit: Payer: BC Managed Care – PPO | Admitting: Endocrinology

## 2022-10-05 VITALS — BP 126/80 | HR 70 | Ht 65.0 in | Wt 164.0 lb

## 2022-10-05 DIAGNOSIS — D235 Other benign neoplasm of skin of trunk: Secondary | ICD-10-CM | POA: Diagnosis not present

## 2022-10-05 DIAGNOSIS — D2262 Melanocytic nevi of left upper limb, including shoulder: Secondary | ICD-10-CM | POA: Diagnosis not present

## 2022-10-05 DIAGNOSIS — E041 Nontoxic single thyroid nodule: Secondary | ICD-10-CM | POA: Diagnosis not present

## 2022-10-05 DIAGNOSIS — L821 Other seborrheic keratosis: Secondary | ICD-10-CM | POA: Diagnosis not present

## 2022-10-05 DIAGNOSIS — D225 Melanocytic nevi of trunk: Secondary | ICD-10-CM | POA: Diagnosis not present

## 2022-10-05 LAB — T3: T3, Total: 116 ng/dL (ref 76–181)

## 2022-10-05 NOTE — Progress Notes (Signed)
Patient ID: Tracy [facility], female   DOB: 02-Apr-1961, 62 y.o.   MRN: [id number]    Reason for Appointment: Left thyroid nodule    History of Present Illness:   Her thyroid nodule was first discovered in the year 2000 incidentally by her gynecologist Initially the nodule was 14 mm in size. Biopsy was done soon after diagnosis and showed mostly colloid with a few benign follicular cells Also previously had negative thyroid antibodies  On exam the nodule measured about 2 cm in 2003 She was given a trial of thyroid suppression for sometime with levothyroxine but this caused relatively low TSH and was stopped  Recent history: She had an ultrasound rechecked in 08/2019 because of the nodule appearing larger and firmer On ultrasound the left-sided thyroid nodule was 2.5 cm with punctate echogenic foci although isoechoic and density  However needle aspiration biopsy showed a benign nodule based on Afirma testing, cytology showed Atypia of undetermined significance  She does not think her nodule is increased in size but she can see it when swallowing She again feels the nodule when she is looking down or swallowing  Again has no difficulty with swallowing or choking sensation Thyroid level is normal as before  Repeat thyroid ultrasound in 2022 December showed no significant change of the left-sided nodule as follows  Nodule 2: 2.7 x 2.6 x 1.9 cm inferior left, previously 2.5 x 2.3 x 1.7; this was previously biopsied.  Lab Results  Component Value Date   TSH 0.75 10/04/2022   Office Visit on 10/04/2022  Component Date Value Ref Range Status   WBC 10/04/2022 4.1  4.0 - 10.5 K/uL Final   RBC 10/04/2022 4.40  3.87 - 5.11 Mil/uL Final   Hemoglobin 10/04/2022 13.4  12.0 - 15.0 g/dL Final   HCT 10/04/2022 40.1  36.0 - 46.0 % Final   MCV 10/04/2022 91.0  78.0 - 100.0 fl Final   MCHC 10/04/2022 33.6  30.0 - 36.0 g/dL Final   RDW 10/04/2022 13.9  11.5 - 15.5 % Final   Platelets 10/04/2022  324.0  150.0 - 400.0 K/uL Final   Neutrophils Relative % 10/04/2022 51.1  43.0 - 77.0 % Final   Lymphocytes Relative 10/04/2022 36.1  12.0 - 46.0 % Final   Monocytes Relative 10/04/2022 9.5  3.0 - 12.0 % Final   Eosinophils Relative 10/04/2022 2.2  0.0 - 5.0 % Final   Basophils Relative 10/04/2022 1.1  0.0 - 3.0 % Final   Neutro Abs 10/04/2022 2.1  1.4 - 7.7 K/uL Final   Lymphs Abs 10/04/2022 1.5  0.7 - 4.0 K/uL Final   Monocytes Absolute 10/04/2022 0.4  0.1 - 1.0 K/uL Final   Eosinophils Absolute 10/04/2022 0.1  0.0 - 0.7 K/uL Final   Basophils Absolute 10/04/2022 0.0  0.0 - 0.1 K/uL Final   Sodium 10/04/2022 139  135 - 145 mEq/L Final   Potassium 10/04/2022 4.5  3.5 - 5.1 mEq/L Final   Chloride 10/04/2022 106  96 - 112 mEq/L Final   CO2 10/04/2022 27  19 - 32 mEq/L Final   Glucose, Bld 10/04/2022 90  70 - 99 mg/dL Final   BUN 10/04/2022 18  6 - 23 mg/dL Final   Creatinine, Ser 10/04/2022 0.82  0.40 - 1.20 mg/dL Final   Total Bilirubin 10/04/2022 0.4  0.2 - 1.2 mg/dL Final   Alkaline Phosphatase 10/04/2022 79  39 - 117 U/L Final   AST 10/04/2022 16  0 - 37 U/L Final   ALT  10/04/2022 18  0 - 35 U/L Final   Total Protein 10/04/2022 7.0  6.0 - 8.3 g/dL Final   Albumin 10/04/2022 4.1  3.5 - 5.2 g/dL Final   GFR 10/04/2022 77.95  >60.00 mL/min Final   Calculated using the CKD-EPI Creatinine Equation (2021)   Calcium 10/04/2022 9.2  8.4 - 10.5 mg/dL Final   Cholesterol 10/04/2022 211 (H)  0 - 200 mg/dL Final   ATP III Classification       Desirable:  < 200 mg/dL               Borderline High:  200 - 239 mg/dL          High:  > = 240 mg/dL   Triglycerides 10/04/2022 78.0  0.0 - 149.0 mg/dL Final   Normal:  <150 mg/dLBorderline High:  150 - 199 mg/dL   HDL 10/04/2022 57.40  >39.00 mg/dL Final   VLDL 10/04/2022 15.6  0.0 - 40.0 mg/dL Final   LDL Cholesterol 10/04/2022 138 (H)  0 - 99 mg/dL Final   Total CHOL/HDL Ratio 10/04/2022 4   Final                  Men          Women1/2 Average Risk      3.4          3.3Average Risk          5.0          4.42X Average Risk          9.6          7.13X Average Risk          15.0          11.0                       NonHDL 10/04/2022 154.06   Final   NOTE:  Non-HDL goal should be 30 mg/dL higher than patient's LDL goal (i.e. LDL goal of < 70 mg/dL, would have non-HDL goal of < 100 mg/dL)   TSH 10/04/2022 0.75  0.35 - 5.50 uIU/mL Final   Free T4 10/04/2022 0.98  0.60 - 1.60 ng/dL Final   Comment: Specimens from patients who are undergoing biotin therapy and /or ingesting biotin supplements may contain high levels of biotin.  The higher biotin concentration in these specimens interferes with this Free T4 assay.  Specimens that contain high levels  of biotin may cause false high results for this Free T4 assay.  Please interpret results in light of the total clinical presentation of the patient.     T3, Total 10/04/2022 116  76 - 181 ng/dL Final     Allergies as of 10/05/2022       Reactions   Meloxicam Other (See Comments)   Canker sores        Medication List        Accurate as of October 05, 2022 11:08 AM. If you have any questions, ask your nurse or doctor.          5-HTP 200 MG Tabs Take by mouth.   acetaminophen 325 MG tablet Commonly known as: TYLENOL Take 650 mg by mouth every 6 (six) hours as needed.   CETIRIZINE HCL PO Take 10 mg by mouth daily.   ibuprofen 200 MG tablet Commonly known as: ADVIL Take 200 mg by mouth every 6 (six) hours as needed.   QC Tumeric Complex 500 MG  Caps Generic drug: Turmeric Take by mouth.        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 Nevada Crane); in  situ--shave excision done, but [facility] did definitive excision: path showed residual melanoma in situ, margins free (11/01/17)   Osteoarthritis of finger    Thyroid nodule 12/1998   L side: Bx benign.Dr. Dwyane Dee following q1-2 yrs.  Pt has been euthyroid (was on synthroid at one point in time but this was d/c'd per pt).  F/u with endo 07/2016 showed stability.  Rpt u/s and FNA benign 09/2019 (Dr. Dwyane Dee). No signif change in nodule 06/2021   Varicose veins     Past Surgical History:  Procedure Laterality Date   ANAL FISSURE REPAIR     CESAREAN SECTION  1997 and 2003   COLONOSCOPY W/ POLYPECTOMY  08/07/2014; 06/25/20   Recall 06/2025.   KNEE ARTHROSCOPY Left 1992   POLYPECTOMY     thyroid nodule biopsy  2000   Benign    Family History  Problem Relation Age of Onset   Arthritis Mother    Hypertension Father    Arthritis Father    Heart disease Father    Thyroid disease Father    Autism Son    Dementia Maternal Grandmother    Cancer Maternal Grandfather        prostate   Cancer Paternal Grandfather        pancreatic cancer   Colon cancer Paternal Grandfather 2   Breast cancer Maternal Aunt    Colon polyps Neg Hx    Esophageal cancer Neg Hx    Stomach cancer Neg Hx    Rectal cancer Neg Hx     Social History:  reports that she has never smoked. She has never used smokeless tobacco. She reports current alcohol use. She reports that she does not use drugs.   Review of Systems:     Examination:   BP 126/80 (BP Location: Left Arm, Patient Position: Sitting, Cuff Size: Large)   Pulse 70   Ht 5\' 5"  (1.651 m)   Wt 164 lb (74.4 kg)   SpO2 96%   BMI 27.29 kg/m      Left thyroid lobe measures about 4 cm relatively more firm in the center but outline of nodule not made out Pemberton sign negative Right lobe of thyroid is not palpable.  No lymphadenopathy in the neck   Assessment/Plan:  Long-standing dominant left-sided nodule benign on biopsy at time of diagnosis in  the year 2000 and also again in 2021 Subjectively does not feel any difference and is not concerned about the size of the nodule or cosmetic effect  Previously the nodule was solid, isoechoic but showed AUS   Ultrasound in 12/22 did not show any significant change in the nodule size and no follow-up was recommended Thyroid level is normal as usual  She will come back annually for follow-up  Elayne Snare 10/05/2022

## 2022-10-06 ENCOUNTER — Encounter: Payer: 59 | Admitting: Family Medicine

## 2023-01-03 DIAGNOSIS — N952 Postmenopausal atrophic vaginitis: Secondary | ICD-10-CM | POA: Diagnosis not present

## 2023-01-03 DIAGNOSIS — Z01419 Encounter for gynecological examination (general) (routine) without abnormal findings: Secondary | ICD-10-CM | POA: Diagnosis not present

## 2023-01-03 DIAGNOSIS — Z1231 Encounter for screening mammogram for malignant neoplasm of breast: Secondary | ICD-10-CM | POA: Diagnosis not present

## 2023-01-03 DIAGNOSIS — Z124 Encounter for screening for malignant neoplasm of cervix: Secondary | ICD-10-CM | POA: Diagnosis not present

## 2023-01-08 ENCOUNTER — Ambulatory Visit
Admission: EM | Admit: 2023-01-08 | Discharge: 2023-01-08 | Disposition: A | Discharge status: Home / Self Care | Payer: BC Managed Care – PPO

## 2023-01-08 ENCOUNTER — Ambulatory Visit (INDEPENDENT_AMBULATORY_CARE_PROVIDER_SITE_OTHER): Payer: BC Managed Care – PPO

## 2023-01-08 DIAGNOSIS — S0033XA Contusion of nose, initial encounter: Secondary | ICD-10-CM

## 2023-01-08 DIAGNOSIS — R519 Headache, unspecified: Secondary | ICD-10-CM

## 2023-01-08 DIAGNOSIS — J3489 Other specified disorders of nose and nasal sinuses: Secondary | ICD-10-CM | POA: Diagnosis not present

## 2023-01-08 MED ORDER — ACETAMINOPHEN 325 MG PO TABS: 650.0000 mg | ORAL_TABLET | Freq: Four times a day (QID) | ORAL | 0 refills | Status: AC | PRN

## 2023-01-08 NOTE — ED Triage Notes (Addendum)
Pt reports on and off headache, pressure around the nose, light pink nose drainage after she bump her forehead and nose with her son forehead last night. Reports she had a nasal bleeding last night after. Motrin and ice compress gives some relief.

## 2023-01-08 NOTE — ED Provider Notes (Addendum)
[facility]  Note:  This document was prepared using Conservation officer, historic buildings and may include unintentional dictation errors.  MRN: [id number] DOB: April 04, 1961  Subjective:   Tracy Moore is a 62 y.o. female presenting for 1 day history of facial pain, forehead pain, pain about the nasal bridge.  Symptoms started after she butted heads with her son last night.  She had a significant nosebleed thereafter.  And today has felt more drainage and persistent pain.  No loss consciousness, confusion, vision change, numbness or tingling, weakness, nausea, vomiting.   No current facility-administered medications for this encounter.  Current Outpatient Medications:    Calcium Carbonate-Vit D-Min (CALCIUM 1200 PO), Take by mouth., Disp: , Rfl:    Multiple Vitamins-Minerals (HAIR SKIN AND NAILS FORMULA PO), Take by mouth., Disp: , Rfl:    Multiple Vitamins-Minerals (MULTIVITAMIN ADULTS 50+ PO), Take by mouth., Disp: , Rfl:    5-Hydroxytryptophan (5-HTP) 200 MG TABS, Take by mouth., Disp: , Rfl:    acetaminophen (TYLENOL) 325 MG tablet, Take 650 mg by mouth every 6 (six) hours as needed., Disp: , Rfl:    CETIRIZINE HCL PO, Take 10 mg by mouth daily., Disp: , Rfl:    ibuprofen (ADVIL,MOTRIN) 200 MG tablet, Take 200 mg by mouth every 6 (six) hours as needed., Disp: , Rfl:    Turmeric (QC TUMERIC COMPLEX) 500 MG CAPS, Take by mouth., Disp: , Rfl:    Allergies  Allergen Reactions   Meloxicam Other (See Comments)    Canker sores    Past Medical History:  Diagnosis Date   Allergic rhinitis    seasonal   History of adenomatous polyp of colon    recall 06/2025   History of depression    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 Margo Aye); in situ--shave excision done, but [facility] did definitive excision: path showed residual melanoma in situ, margins free (11/01/17)   Osteoarthritis of finger     Thyroid nodule 12/1998   L side: Bx benign.Dr. Lucianne Muss following q1-2 yrs.  Pt has been euthyroid (was on synthroid at one point in time but this was d/c'd per pt).  F/u with endo 07/2016 showed stability.  Rpt u/s and FNA benign 09/2019 (Dr. Lucianne Muss). No signif change in nodule 06/2021   Varicose veins      Past Surgical History:  Procedure Laterality Date   ANAL FISSURE REPAIR     CESAREAN SECTION  1997 and 2003   COLONOSCOPY W/ POLYPECTOMY  08/07/2014; 06/25/20   Recall 06/2025.   KNEE ARTHROSCOPY Left 1992   POLYPECTOMY     thyroid nodule biopsy  2000   Benign    Family History  Problem Relation Age of Onset   Arthritis Mother    Hypertension Father    Arthritis Father    Heart disease Father    Thyroid disease Father    Autism Son    Dementia Maternal Grandmother    Cancer Maternal Grandfather        prostate   Cancer Paternal Grandfather        pancreatic cancer   Colon cancer Paternal Grandfather 59   Breast cancer Maternal Aunt    Colon polyps Neg Hx    Esophageal cancer Neg Hx    Stomach cancer Neg Hx    Rectal cancer Neg Hx     Social History   Tobacco Use  Smoking status: Never   Smokeless tobacco: Never  Vaping Use   Vaping Use: Never used  Substance Use Topics   Alcohol use: Yes    Alcohol/week: 0.0 standard drinks of alcohol    Comment: socially   Drug use: No    ROS   Objective:   Vitals: BP 130/85 (BP Location: Right Arm)   Pulse 75   Temp 98.7 F (37.1 C) (Oral)   Resp 16   SpO2 97%   Physical Exam Constitutional:      General: She is not in acute distress.    Appearance: Normal appearance. She is well-developed and normal weight. She is not ill-appearing, toxic-appearing or diaphoretic.  HENT:     Head: Normocephalic and atraumatic.     Right Ear: Tympanic membrane, ear canal and external ear normal. No drainage or tenderness. No middle ear effusion. There is no impacted cerumen. Tympanic membrane is not erythematous or bulging.      Left Ear: Tympanic membrane, ear canal and external ear normal. No drainage or tenderness.  No middle ear effusion. There is no impacted cerumen. Tympanic membrane is not erythematous or bulging.     Nose: Nasal tenderness (bridge of the nose) and mucosal edema present. No congestion or rhinorrhea.     Mouth/Throat:     Mouth: Mucous membranes are moist. No oral lesions.     Pharynx: No pharyngeal swelling, oropharyngeal exudate, posterior oropharyngeal erythema or uvula swelling.     Tonsils: No tonsillar exudate or tonsillar abscesses.  Eyes:     General: No scleral icterus.       Right eye: No discharge.        Left eye: No discharge.     Extraocular Movements: Extraocular movements intact.     Right eye: Normal extraocular motion.     Left eye: Normal extraocular motion.     Conjunctiva/sclera: Conjunctivae normal.  Cardiovascular:     Rate and Rhythm: Normal rate.  Pulmonary:     Effort: Pulmonary effort is normal.  Musculoskeletal:     Cervical back: Normal range of motion and neck supple.  Lymphadenopathy:     Cervical: No cervical adenopathy.  Skin:    General: Skin is warm and dry.  Neurological:     General: No focal deficit present.     Mental Status: She is alert and oriented to person, place, and time.     Cranial Nerves: No cranial nerve deficit.     Motor: No weakness.     Coordination: Coordination normal.     Gait: Gait normal.  Psychiatric:        Mood and Affect: Mood normal.        Behavior: Behavior normal.     Assessment and Plan :   PDMP not reviewed this encounter.  1. Contusion of nose, initial encounter   2. Facial pain   3. Generalized headache    X-ray over-read was pending at time of discharge, recommended follow up with only abnormal results. Otherwise will not call for negative over-read. Patient was in agreement. Patient has a reassuring neurologic exam.  Recommended conservative management using Tylenol for her facial pain, general  headache.  Suspect facial contusion, nasal contusion.  Discussed warning signs and symptoms of head injury.  Counseled patient on potential for adverse effects with medications prescribed/recommended today, ER and return-to-clinic precautions discussed, patient verbalized understanding.     Wallis Bamberg, [place] 01/08/23 1905

## 2023-07-06 ENCOUNTER — Other Ambulatory Visit: Payer: Self-pay

## 2023-09-20 ENCOUNTER — Ambulatory Visit: Payer: BC Managed Care – PPO | Admitting: Endocrinology

## 2023-10-25 ENCOUNTER — Other Ambulatory Visit: Payer: Self-pay | Admitting: Internal Medicine

## 2023-10-25 DIAGNOSIS — Z8349 Family history of other endocrine, nutritional and metabolic diseases: Secondary | ICD-10-CM | POA: Diagnosis not present

## 2023-10-25 DIAGNOSIS — Z8759 Personal history of other complications of pregnancy, childbirth and the puerperium: Secondary | ICD-10-CM | POA: Diagnosis not present

## 2023-10-25 DIAGNOSIS — E041 Nontoxic single thyroid nodule: Secondary | ICD-10-CM

## 2023-11-01 ENCOUNTER — Ambulatory Visit
Admission: RE | Admit: 2023-11-01 | Discharge: 2023-11-01 | Disposition: A | Discharge status: Home / Self Care | Source: Ambulatory Visit | Attending: Internal Medicine | Admitting: Internal Medicine

## 2023-11-01 DIAGNOSIS — E041 Nontoxic single thyroid nodule: Secondary | ICD-10-CM | POA: Diagnosis not present

## 2023-12-20 ENCOUNTER — Telehealth: Payer: Self-pay

## 2023-12-20 DIAGNOSIS — L821 Other seborrheic keratosis: Secondary | ICD-10-CM | POA: Diagnosis not present

## 2023-12-20 DIAGNOSIS — E7800 Pure hypercholesterolemia, unspecified: Secondary | ICD-10-CM

## 2023-12-20 DIAGNOSIS — E041 Nontoxic single thyroid nodule: Secondary | ICD-10-CM

## 2023-12-20 DIAGNOSIS — D235 Other benign neoplasm of skin of trunk: Secondary | ICD-10-CM | POA: Diagnosis not present

## 2023-12-20 DIAGNOSIS — D220 Melanocytic nevi of lip: Secondary | ICD-10-CM | POA: Diagnosis not present

## 2023-12-20 DIAGNOSIS — L814 Other melanin hyperpigmentation: Secondary | ICD-10-CM | POA: Diagnosis not present

## 2023-12-20 NOTE — Telephone Encounter (Signed)
 Reason for CRM: Patient called in regarding lab orders for her physical , would like them to be done early before appointment, would like a callback once orders for labs are order to be scheduled

## 2023-12-20 NOTE — Telephone Encounter (Signed)
 Please Advise if this request can be completed

## 2023-12-20 NOTE — Telephone Encounter (Signed)
Pt scheduled for lab visit 

## 2023-12-20 NOTE — Telephone Encounter (Signed)
Okay, labs ordered

## 2023-12-27 ENCOUNTER — Other Ambulatory Visit

## 2023-12-27 DIAGNOSIS — E7800 Pure hypercholesterolemia, unspecified: Secondary | ICD-10-CM

## 2023-12-27 DIAGNOSIS — E041 Nontoxic single thyroid nodule: Secondary | ICD-10-CM

## 2023-12-28 LAB — COMPREHENSIVE METABOLIC PANEL WITH GFR
AG Ratio: 1.5 (calc) (ref 1.0–2.5)
ALT: 22 U/L (ref 6–29)
AST: 19 U/L (ref 10–35)
Albumin: 4.1 g/dL (ref 3.6–5.1)
Alkaline phosphatase (APISO): 81 U/L (ref 37–153)
BUN: 17 mg/dL (ref 7–25)
CO2: 25 mmol/L (ref 20–32)
Calcium: 9 mg/dL (ref 8.6–10.4)
Chloride: 107 mmol/L (ref 98–110)
Creat: 0.74 mg/dL (ref 0.50–1.05)
Globulin: 2.7 g/dL (ref 1.9–3.7)
Glucose, Bld: 99 mg/dL (ref 65–99)
Potassium: 4.5 mmol/L (ref 3.5–5.3)
Sodium: 141 mmol/L (ref 135–146)
Total Bilirubin: 0.3 mg/dL (ref 0.2–1.2)
Total Protein: 6.8 g/dL (ref 6.1–8.1)
eGFR: 92 mL/min/{1.73_m2} (ref >=60)

## 2023-12-28 LAB — CBC WITH DIFFERENTIAL/PLATELET
Absolute Lymphocytes: 1670 {cells}/uL (ref 850–3900)
Absolute Monocytes: 465 {cells}/uL (ref 200–950)
Basophils Absolute: 60 {cells}/uL (ref 0–200)
Basophils Relative: 1.3 %
Eosinophils Absolute: 92 {cells}/uL (ref 15–500)
Eosinophils Relative: 2 %
HCT: 41.3 % (ref 35.0–45.0)
Hemoglobin: 13.4 g/dL (ref 11.7–15.5)
MCH: 30.5 pg (ref 27.0–33.0)
MCHC: 32.4 g/dL (ref 32.0–36.0)
MCV: 94.1 fL (ref 80.0–100.0)
MPV: 9.5 fL (ref 7.5–12.5)
Monocytes Relative: 10.1 %
Neutro Abs: 2314 {cells}/uL (ref 1500–7800)
Neutrophils Relative %: 50.3 %
Platelets: 342 10*3/uL (ref 140–400)
RBC: 4.39 10*6/uL (ref 3.80–5.10)
RDW: 14.7 % (ref 11.0–15.0)
Total Lymphocyte: 36.3 %
WBC: 4.6 10*3/uL (ref 3.8–10.8)

## 2023-12-28 LAB — LIPID PANEL
Cholesterol: 206 mg/dL — ABNORMAL HIGH (ref <200)
HDL: 49 mg/dL — ABNORMAL LOW (ref >=50)
LDL Cholesterol (Calc): 135 mg/dL — ABNORMAL HIGH
Non-HDL Cholesterol (Calc): 157 mg/dL — ABNORMAL HIGH (ref <130)
Total CHOL/HDL Ratio: 4.2 (calc) (ref <5.0)
Triglycerides: 110 mg/dL (ref <150)

## 2024-01-02 ENCOUNTER — Encounter: Admitting: Family Medicine

## 2024-01-04 ENCOUNTER — Ambulatory Visit (INDEPENDENT_AMBULATORY_CARE_PROVIDER_SITE_OTHER): Admitting: Family Medicine

## 2024-01-04 ENCOUNTER — Encounter: Payer: Self-pay | Admitting: Family Medicine

## 2024-01-04 VITALS — BP 110/70 | HR 79 | Temp 99.0°F | Ht 66.0 in | Wt 173.8 lb

## 2024-01-04 DIAGNOSIS — Z0000 Encounter for general adult medical examination without abnormal findings: Secondary | ICD-10-CM

## 2024-01-04 NOTE — Progress Notes (Signed)
 Office Note 01/04/2024  CC:  Chief Complaint  Patient presents with   Annual Exam   HPI:  Patient is a 63 y.o. female who is here for annual health maintenance exam.  Feeling well. 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  Most recent follow-up for her thyroid  nodule (Dr. Faythe) showed everything stable on ultrasound, no further imaging is indicated.  Thyroid  panel was normal.  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 Shona); in situ--shave excision done, but [facility] did definitive excision: path showed residual melanoma in situ, margins free (11/01/17)   Osteoarthritis of finger    Thyroid  nodule 12/1998   L side: Bx benign.Dr. Von following q1-2 yrs.  Pt has been euthyroid (was on synthroid at one point in time but this was d/c'd per pt).  F/u with endo 07/2016 showed stability.  Rpt u/s and FNA benign 09/2019 (Dr. Von). No signif change in nodule 06/2021   Varicose veins     Past Surgical History:  Procedure Laterality Date   ANAL FISSURE REPAIR     CESAREAN SECTION  1997 and 2003   COLONOSCOPY W/ POLYPECTOMY  08/07/2014; 06/25/20   Recall 06/2025.   KNEE ARTHROSCOPY Left 1992   POLYPECTOMY     thyroid  nodule biopsy  2000   Benign    Family History  Problem Relation Age of Onset   Arthritis Mother    Hypertension Father    Arthritis Father    Heart disease Father    Thyroid  disease Father    Autism Son    Dementia Maternal Grandmother    Cancer Maternal Grandfather        prostate   Cancer Paternal Grandfather        pancreatic cancer   Colon cancer Paternal Grandfather 50   Breast cancer Maternal Aunt    Colon polyps Neg Hx    Esophageal cancer Neg Hx    Stomach cancer Neg Hx    Rectal cancer Neg Hx      Social History   Socioeconomic History   Marital status: Married    Spouse name: Not on file   Number of children: Not on file   Years of education: Not on file   Highest education level: Bachelor's degree (e.g., BA, AB, BS)  Occupational History   Not on file  Tobacco Use   Smoking status: Never   Smokeless tobacco: Never  Vaping Use   Vaping status: Never Used  Substance and Sexual Activity   Alcohol use: Yes    Alcohol/week: 0.0 standard drinks of alcohol    Comment: socially   Drug use: No   Sexual activity: Yes  Other Topics Concern   Not on file  Social History Narrative   Married, 2 kids.  Son with special needs/autism.   Lives in [place].Occupation: Charity fundraiser at [employer].No tobacco.  Occ alc.  No drugs.   Social Drivers of Corporate investment banker Strain: Low Risk  (01/03/2024)   Overall Financial Resource Strain (CARDIA)    Difficulty of Paying Living Expenses: Not hard at all  Food Insecurity: No Food Insecurity (01/03/2024)   Hunger Vital Sign    Worried About Running  Out of Food in the Last Year: Never true    Ran Out of Food in the Last Year: Never true  Transportation Needs: No Transportation Needs (01/03/2024)   PRAPARE - Administrator, Civil Service (Medical): No    Lack of Transportation (Non-Medical): No  Physical Activity: Insufficiently Active (01/03/2024)   Exercise Vital Sign    Days of Exercise per Week: 2 days    Minutes of Exercise per Session: 10 min  Stress: No Stress Concern Present (01/03/2024)   [employer] of Occupational Health - Occupational Stress Questionnaire    Feeling of Stress: Only a little  Social Connections: Moderately Isolated (01/03/2024)   Social Connection and Isolation Panel    Frequency of Communication with Friends and Family: More than three times a week    Frequency of Social Gatherings with Friends and Family: Once a week    Attends Religious Services: Never    Database administrator  or Organizations: No    Attends Engineer, structural: Not on file    Marital Status: Married  Catering manager Violence: Not on file    Outpatient Medications Prior to Visit  Medication Sig Dispense Refill   5-Hydroxytryptophan (5-HTP) 200 MG TABS Take by mouth.     acetaminophen  (TYLENOL ) 325 MG tablet Take 2 tablets (650 mg total) by mouth every 6 (six) hours as needed for moderate pain. 30 tablet 0   Calcium Carbonate-Vit D-Min (CALCIUM 1200 PO) Take by mouth.     ibuprofen (ADVIL,MOTRIN) 200 MG tablet Take 200 mg by mouth every 6 (six) hours as needed.     Multiple Vitamins-Minerals (MULTIVITAMIN ADULTS 50+ PO) Take by mouth.     Turmeric (QC TUMERIC COMPLEX) 500 MG CAPS Take by mouth.     CETIRIZINE HCL PO Take 10 mg by mouth daily.     Multiple Vitamins-Minerals (HAIR SKIN AND NAILS FORMULA PO) Take by mouth.     No facility-administered medications prior to visit.    Allergies  Allergen Reactions   Meloxicam  Other (See Comments)    Canker sores    Review of Systems  Constitutional:  Negative for appetite change, chills, fatigue and fever.  HENT:  Negative for congestion, dental problem, ear pain and sore throat.   Eyes:  Negative for discharge, redness and visual disturbance.  Respiratory:  Negative for cough, chest tightness, shortness of breath and wheezing.   Cardiovascular:  Negative for chest pain, palpitations and leg swelling.  Gastrointestinal:  Negative for abdominal pain, blood in stool, diarrhea, nausea and vomiting.  Genitourinary:  Negative for difficulty urinating, dysuria, flank pain, frequency, hematuria and urgency.  Musculoskeletal:  Negative for arthralgias, back pain, joint swelling, myalgias and neck stiffness.  Skin:  Negative for pallor and rash.  Neurological:  Negative for dizziness, speech difficulty, weakness and headaches.  Hematological:  Negative for adenopathy. Does not bruise/bleed easily.  Psychiatric/Behavioral:  Negative for  confusion and sleep disturbance. The patient is not nervous/anxious.     PE;    01/04/2024    3:33 PM 01/08/2023    6:41 PM 10/05/2022   10:40 AM  Vitals with BMI  Height 5' 6  5' 5  Weight 173 lbs 13 oz  164 lbs  BMI 28.07  27.29  Systolic 110 130 873  Diastolic 70 85 80  Pulse 79 75 70   Gen: Alert, well appearing.  Patient is oriented to person, place, time, and situation. AFFECT: pleasant, lucid thought and speech. ENT: Ears:  EACs clear, normal epithelium.  TMs with good light reflex and landmarks bilaterally.  Eyes: no injection, icteris, swelling, or exudate.  EOMI, PERRLA. Nose: no drainage or turbinate edema/swelling.  No injection or focal lesion.  Mouth: lips without lesion/swelling.  Oral mucosa pink and moist.  Dentition intact and without obvious caries or gingival swelling.  Oropharynx without erythema, exudate, or swelling.  Neck: supple/nontender.  No LAD, mass, or TM.  Carotid pulses 2+ bilaterally, without bruits. CV: RRR, no m/r/g.   LUNGS: CTA bilat, nonlabored resps, good aeration in all lung fields. ABD: soft, NT, ND, BS normal.  No hepatospenomegaly or mass.  No bruits. EXT: no clubbing, cyanosis, or edema.  Musculoskeletal: no joint swelling, erythema, warmth, or tenderness.  ROM of all joints intact. Skin - no sores or suspicious lesions or rashes or color changes  Pertinent labs:  Lab Results  Component Value Date   TSH 0.75 10/04/2022   Lab Results  Component Value Date   WBC 4.6 12/27/2023   HGB 13.4 12/27/2023   HCT 41.3 12/27/2023   MCV 94.1 12/27/2023   PLT 342 12/27/2023   Lab Results  Component Value Date   CREATININE 0.74 12/27/2023   BUN 17 12/27/2023   NA 141 12/27/2023   K 4.5 12/27/2023   CL 107 12/27/2023   CO2 25 12/27/2023   Lab Results  Component Value Date   ALT 22 12/27/2023   AST 19 12/27/2023   ALKPHOS 79 10/04/2022   BILITOT 0.3 12/27/2023   Lab Results  Component Value Date   CHOL 206 (H) 12/27/2023   Lab  Results  Component Value Date   HDL 49 (L) 12/27/2023   Lab Results  Component Value Date   LDLCALC 135 (H) 12/27/2023   Lab Results  Component Value Date   TRIG 110 12/27/2023   Lab Results  Component Value Date   CHOLHDL 4.2 12/27/2023   ASSESSMENT AND PLAN:   Health maintenance exam: Reviewed age and gender appropriate health maintenance issues (prudent diet, regular exercise, health risks of tobacco and excessive alcohol, use of seatbelts, fire alarms in home, use of sunscreen).  Also reviewed age and gender appropriate health screening as well as vaccine recommendations. Vaccines: Tdap due->pt states she got this approx 2018 via employer.  Shingrix->pt declines. Labs: reviewed recent cbc, cmet, and lipid panel in detail today. Cervical ca screening: per GYN MD. Breast ca screening: UTD mammogram, plans on f/u with GYN later this year.  [facility]. Colon ca screening: hx of adenomas, recall 06/2025. Osteoporosis screening: DEXA was done 02/10/2021 (GYN MD), T-score -0.5.  LDL 135. Her 10-year Framingham cardiovascular risk equals 3.5%.  An After Visit Summary was printed and given to the patient.  FOLLOW UP:  Return for annual CPE with fasting labs the week prior.  Signed:  Gerlene Hockey, MD           01/04/2024

## 2024-01-24 DIAGNOSIS — Z1331 Encounter for screening for depression: Secondary | ICD-10-CM | POA: Diagnosis not present

## 2024-01-24 DIAGNOSIS — Z1231 Encounter for screening mammogram for malignant neoplasm of breast: Secondary | ICD-10-CM | POA: Diagnosis not present

## 2024-01-24 DIAGNOSIS — Z01419 Encounter for gynecological examination (general) (routine) without abnormal findings: Secondary | ICD-10-CM | POA: Diagnosis not present

## 2024-01-25 LAB — HM MAMMOGRAPHY

## 2024-04-21 NOTE — Telephone Encounter (Signed)
 Records in media section of chart., abstracted and HM updated.
